# Patient Record
Sex: Female | Born: 1970 | Race: Black or African American | Hispanic: No | Marital: Married | Smoking: Never smoker
Health system: Southern US, Community
[De-identification: ages and names within clinical notes are randomized; demographics above are authoritative.]

## PROBLEM LIST (undated history)

## (undated) DIAGNOSIS — Z780 Asymptomatic menopausal state: Secondary | ICD-10-CM

## (undated) DIAGNOSIS — N6019 Diffuse cystic mastopathy of unspecified breast: Secondary | ICD-10-CM

## (undated) HISTORY — DX: Asymptomatic menopausal state: Z78.0

## (undated) HISTORY — PX: WISDOM TOOTH EXTRACTION: SHX21

## (undated) HISTORY — PX: BREAST EXCISIONAL BIOPSY: SUR124

## (undated) HISTORY — DX: Diffuse cystic mastopathy of unspecified breast: N60.19

---

## 2005-08-24 ENCOUNTER — Encounter: Admission: RE | Admit: 2005-08-24 | Discharge: 2005-08-24 | Payer: Self-pay | Admitting: Family Medicine

## 2008-04-03 HISTORY — PX: BREAST SURGERY: SHX581

## 2008-07-02 ENCOUNTER — Ambulatory Visit: Payer: Self-pay | Admitting: General Surgery

## 2008-07-09 ENCOUNTER — Ambulatory Visit: Payer: Self-pay | Admitting: General Surgery

## 2011-01-23 ENCOUNTER — Observation Stay: Payer: Self-pay

## 2011-01-27 ENCOUNTER — Inpatient Hospital Stay: Payer: Self-pay

## 2011-04-04 DIAGNOSIS — N6019 Diffuse cystic mastopathy of unspecified breast: Secondary | ICD-10-CM | POA: Insufficient documentation

## 2011-04-04 HISTORY — DX: Diffuse cystic mastopathy of unspecified breast: N60.19

## 2011-08-24 DIAGNOSIS — M654 Radial styloid tenosynovitis [de Quervain]: Secondary | ICD-10-CM | POA: Insufficient documentation

## 2012-08-13 ENCOUNTER — Encounter: Payer: Self-pay | Admitting: *Deleted

## 2012-08-20 ENCOUNTER — Ambulatory Visit: Payer: Self-pay | Admitting: General Surgery

## 2012-09-03 ENCOUNTER — Ambulatory Visit (INDEPENDENT_AMBULATORY_CARE_PROVIDER_SITE_OTHER): Payer: BC Managed Care – PPO | Admitting: General Surgery

## 2012-09-03 ENCOUNTER — Encounter: Payer: Self-pay | Admitting: General Surgery

## 2012-09-03 VITALS — BP 110/62 | HR 74 | Resp 12 | Ht 61.0 in | Wt 122.0 lb

## 2012-09-03 DIAGNOSIS — N6019 Diffuse cystic mastopathy of unspecified breast: Secondary | ICD-10-CM

## 2012-09-03 DIAGNOSIS — N644 Mastodynia: Secondary | ICD-10-CM

## 2012-09-03 NOTE — Progress Notes (Signed)
Patient ID: Deborah Armstrong, female   DOB: 27-Feb-1971, 42 y.o.   MRN: [id number]  Chief Complaint  Patient presents with  . Breast Problem    pain    HPI Deborah Armstrong is a 42 y.o. female.  Patient here today for breast pain evaluation for about 4-5 months.  States that it comes and goes and only last less than a minute upper outer quadrant area.  No breast trauma or  Injury.  States it is happening in both breast.  Was last seen here in July 2013, known history of fibrocystic breast disease and with left breast cyst. No family history of breast cancer.   HPI  Past Medical History  Diagnosis Date  . Diffuse cystic mastopathy 2013  . Breast screening, unspecified   . Breast complaint 2011    Past Surgical History  Procedure Laterality Date  . Cesarean section  2012  . Breast surgery Left 2010    FNS and left breast mass excision    History reviewed. No pertinent family history.  Social History History  Substance Use Topics  . Smoking status: Never Smoker   . Smokeless tobacco: Not on file  . Alcohol Use: Yes     Comment: 1-2 drinks    No Known Allergies  No current outpatient prescriptions on file.   No current facility-administered medications for this visit.    Review of Systems Review of Systems  Constitutional: Negative.   Respiratory: Negative.   Cardiovascular: Negative.     Blood pressure 110/62, pulse 74, resp. rate 12, height 5\' 1"  (1.549 m), weight 122 lb (55.339 kg), last menstrual period 08/20/2012.  Physical Exam Physical Exam  Constitutional: She is oriented to person, place, and time. She appears well-developed and well-nourished.  Eyes: Conjunctivae are normal.  Pulmonary/Chest: Right breast exhibits no inverted nipple, no mass, no nipple discharge, no skin change and no tenderness. Left breast exhibits no inverted nipple, no mass, no nipple discharge, no skin change and no tenderness.  Lymphadenopathy:    She has no cervical adenopathy.     She has no axillary adenopathy.  Neurological: She is alert and oriented to person, place, and time.  Skin: Skin is warm and dry.    Data Reviewed none  Assessment    Symmetrical bilateral breast pain outer aspect intermittent in nature.  No findings on exam.    Plan     Mammogram and office visit in July as scheduled. Ultrasound in office if indicated.       Keishawn Rajewski G 09/05/2012, 8:18 AM

## 2012-09-03 NOTE — Patient Instructions (Addendum)
Continue self breast exams. Call office for any new breast issues or concerns.  Mammogram and office visit to follow in July

## 2012-09-05 ENCOUNTER — Encounter: Payer: Self-pay | Admitting: General Surgery

## 2012-11-05 ENCOUNTER — Ambulatory Visit: Payer: BC Managed Care – PPO | Admitting: General Surgery

## 2012-11-06 ENCOUNTER — Encounter: Payer: Self-pay | Admitting: General Surgery

## 2012-12-16 ENCOUNTER — Encounter: Payer: Self-pay | Admitting: General Surgery

## 2012-12-16 ENCOUNTER — Ambulatory Visit (INDEPENDENT_AMBULATORY_CARE_PROVIDER_SITE_OTHER): Payer: BC Managed Care – PPO | Admitting: General Surgery

## 2012-12-16 VITALS — BP 106/60 | HR 77 | Resp 13 | Ht 64.0 in | Wt 123.0 lb

## 2012-12-16 DIAGNOSIS — N6019 Diffuse cystic mastopathy of unspecified breast: Secondary | ICD-10-CM

## 2012-12-16 NOTE — Patient Instructions (Addendum)

## 2012-12-16 NOTE — Progress Notes (Addendum)
Patient ID: Deborah Armstrong, female   DOB: [birth year], 42 y.o.   MRN: [id number]  Chief Complaint  Patient presents with  . Follow-up    mammogram    HPI Deborah Armstrong is a 42 y.o. female who presents for a breast evaluation. The most recent mammogram was done on 11/05/12 cat 1. Patient does perform regular self breast checks and gets regular mammograms done.    HPI  Past Medical History  Diagnosis Date  . Diffuse cystic mastopathy 2013  . Breast screening, unspecified   . Breast complaint 2011    Past Surgical History  Procedure Laterality Date  . Cesarean section  2012  . Breast surgery Left 2010    FNS and left breast mass excision    History reviewed. No pertinent family history.  Social History History  Substance Use Topics  . Smoking status: Never Smoker   . Smokeless tobacco: Never Used  . Alcohol Use: Yes     Comment: 1-2 drinks    No Known Allergies  No current outpatient prescriptions on file.   No current facility-administered medications for this visit.    Review of Systems Review of Systems  Constitutional: Negative.   Respiratory: Negative.   Cardiovascular: Negative.     Blood pressure 106/60, pulse 77, resp. rate 13, height 5\' 4"  (1.626 m), weight 123 lb (55.792 kg), last menstrual period 12/02/2012.  Physical Exam Physical Exam  Constitutional: She is oriented to person, place, and time. She appears well-developed and well-nourished.  Eyes: Conjunctivae are normal. No scleral icterus.  Neck: No mass and no thyromegaly present.  Cardiovascular: Normal rate, regular rhythm, normal heart sounds, intact distal pulses and normal pulses.   Pulmonary/Chest: Breath sounds normal. Right breast exhibits no inverted nipple, no mass, no nipple discharge, no skin change and no tenderness. Left breast exhibits no inverted nipple, no mass, no nipple discharge, no skin change and no tenderness.  Abdominal: Soft. Normal appearance and bowel sounds are  normal. There is no hepatosplenomegaly. There is no tenderness. No hernia.  Lymphadenopathy:    She has no cervical adenopathy.    She has no axillary adenopathy.  Neurological: She is alert and oriented to person, place, and time.  Skin: Skin is warm and dry.    Data Reviewed Mammogram reviewed  Assessment    Stable exam.     Plan    Patient to return in one year after mammogram.       SANKAR,SEEPLAPUTHUR G 12/16/2012, 3:13 PM

## 2013-12-11 ENCOUNTER — Encounter: Payer: Self-pay | Admitting: General Surgery

## 2013-12-17 ENCOUNTER — Ambulatory Visit: Payer: BC Managed Care – PPO | Admitting: General Surgery

## 2014-01-01 ENCOUNTER — Ambulatory Visit (INDEPENDENT_AMBULATORY_CARE_PROVIDER_SITE_OTHER): Payer: BC Managed Care – PPO | Admitting: General Surgery

## 2014-01-01 ENCOUNTER — Encounter: Payer: Self-pay | Admitting: General Surgery

## 2014-01-01 VITALS — BP 108/4 | HR 74 | Resp 12 | Ht 61.0 in | Wt 127.0 lb

## 2014-01-01 DIAGNOSIS — Z9889 Other specified postprocedural states: Secondary | ICD-10-CM

## 2014-01-01 NOTE — Patient Instructions (Signed)
Patient to return in 1 year for follow up.Continue self breast exams. Call office for any new breast issues or concerns.

## 2014-01-01 NOTE — Progress Notes (Signed)
Patient ID: Deborah Armstrong, female   DOB: [birth year], 43 y.o.   MRN: [id number]  Chief Complaint  Patient presents with  . Follow-up    1 year follow up mammogram     HPI Deborah Armstrong is a 43 y.o. female who presents for a breast evaluation. The most recent mammogram was done on 12/10/13. Patient does perform regular self breast checks and gets regular mammograms done. The patient denies any new problems at this time.    HPI  Past Medical History  Diagnosis Date  . Diffuse cystic mastopathy 2013  . Breast screening, unspecified   . Breast complaint 2011    Past Surgical History  Procedure Laterality Date  . Cesarean section  2012  . Breast surgery Left 2010    FNS and left breast mass excision    Family History  Problem Relation Age of Onset  . Cancer Neg Hx     Social History History  Substance Use Topics  . Smoking status: Never Smoker   . Smokeless tobacco: Never Used  . Alcohol Use: Yes     Comment: 1-2 drinks    No Known Allergies  No current outpatient prescriptions on file.   No current facility-administered medications for this visit.    Review of Systems Review of Systems  Constitutional: Negative.   Respiratory: Negative.   Cardiovascular: Negative.     Blood pressure 108/4, pulse 74, resp. rate 12, height 5' 1"  (1.549 m), weight 127 lb (57.607 kg), last menstrual period 12/25/2013.  Physical Exam Physical Exam  Constitutional: She is oriented to person, place, and time. She appears well-developed and well-nourished.  Eyes: Conjunctivae are normal. No scleral icterus.  Neck: Neck supple. No thyromegaly present.  Cardiovascular: Normal rate, regular rhythm and normal heart sounds.   No murmur heard. Pulmonary/Chest: Effort normal and breath sounds normal. Right breast exhibits no inverted nipple, no mass, no nipple discharge, no skin change and no tenderness. Left breast exhibits no inverted nipple, no mass, no nipple discharge, no skin  change and no tenderness.  Lymphadenopathy:    She has no cervical adenopathy.    She has no axillary adenopathy.  Neurological: She is alert and oriented to person, place, and time.  Skin: Skin is warm and dry.    Data Reviewed  None  Assessment    Stable exam.     Plan    Patient to return in 1 year bilateral screening mammogram.        Carmellia Kreisler G 01/02/2014, 10:40 AM

## 2014-01-02 ENCOUNTER — Encounter: Payer: Self-pay | Admitting: General Surgery

## 2014-02-02 ENCOUNTER — Encounter: Payer: Self-pay | Admitting: General Surgery

## 2014-10-22 ENCOUNTER — Other Ambulatory Visit: Payer: Self-pay

## 2014-10-22 DIAGNOSIS — Z1231 Encounter for screening mammogram for malignant neoplasm of breast: Secondary | ICD-10-CM

## 2014-12-23 ENCOUNTER — Ambulatory Visit: Payer: Self-pay | Admitting: General Surgery

## 2015-01-06 ENCOUNTER — Ambulatory Visit: Payer: Self-pay | Admitting: General Surgery

## 2015-01-14 ENCOUNTER — Ambulatory Visit (INDEPENDENT_AMBULATORY_CARE_PROVIDER_SITE_OTHER): Payer: 59 | Admitting: General Surgery

## 2015-01-14 ENCOUNTER — Encounter: Payer: Self-pay | Admitting: General Surgery

## 2015-01-14 VITALS — BP 98/60 | HR 82 | Resp 12 | Ht 61.0 in | Wt 120.0 lb

## 2015-01-14 DIAGNOSIS — N6019 Diffuse cystic mastopathy of unspecified breast: Secondary | ICD-10-CM

## 2015-01-14 NOTE — Patient Instructions (Signed)
Call if any concerns.

## 2015-01-14 NOTE — Progress Notes (Signed)
Patient ID: Deborah Armstrong, female   DOB: September 30, 1970, 44 y.o.   MRN: [id number]  Chief Complaint  Patient presents with  . Follow-up    mammogram     HPI Deborah Armstrong is a 44 y.o. female who presents for a breast evaluation. The most recent mammogram was done on 12/15/14.  Patient does perform regular self breast checks and gets regular mammograms done.  No new breast issues. She is post-menopausal and has vasomotor symptoms.  HPI  Past Medical History  Diagnosis Date  . Diffuse cystic mastopathy 2013  . Breast screening, unspecified   . Breast complaint 2011    Past Surgical History  Procedure Laterality Date  . Cesarean section  2012  . Breast surgery Left 2010    FNA and left breast mass excision    Family History  Problem Relation Age of Onset  . Cancer Neg Hx     Social History Social History  Substance Use Topics  . Smoking status: Never Smoker   . Smokeless tobacco: Never Used  . Alcohol Use: Yes     Comment: 1-2 drinks    No Known Allergies  No current outpatient prescriptions on file.   No current facility-administered medications for this visit.    Review of Systems Review of Systems  Constitutional: Negative.   Respiratory: Negative.   Cardiovascular: Negative.     Blood pressure 98/60, pulse 82, resp. rate 12, height 5' 1"  (1.549 m), weight 120 lb (54.432 kg), last menstrual period 09/03/2014.  Physical Exam Physical Exam  Constitutional: She is oriented to person, place, and time. She appears well-developed and well-nourished.  HENT:  Mouth/Throat: Oropharynx is clear and moist.  Eyes: Conjunctivae are normal. No scleral icterus.  Neck: Neck supple.  Cardiovascular: Normal rate, regular rhythm and normal heart sounds.   Pulmonary/Chest: Effort normal and breath sounds normal. Right breast exhibits no inverted nipple, no mass, no nipple discharge, no skin change and no tenderness. Left breast exhibits no inverted nipple, no mass, no  nipple discharge, no skin change and no tenderness.  Left breast: Minimal scarring palpable underneath previous biopsy site upper outer quadrant  Lymphadenopathy:    She has no cervical adenopathy.    She has no axillary adenopathy.  Neurological: She is alert and oriented to person, place, and time.  Skin: Skin is warm and dry.  Psychiatric: Her behavior is normal.    Data Reviewed Mammogram report.  Assessment    Stable breast exam.    Plan    Return in 1 year with bilateral screening mammogram.     PCP: Oren Binet 01/14/2015, 6:52 PM

## 2015-11-30 ENCOUNTER — Encounter: Payer: Self-pay | Admitting: *Deleted

## 2016-01-10 ENCOUNTER — Ambulatory Visit: Payer: 59 | Admitting: General Surgery

## 2016-01-20 ENCOUNTER — Encounter: Payer: Self-pay | Admitting: *Deleted

## 2016-01-21 ENCOUNTER — Encounter: Payer: Self-pay | Admitting: General Surgery

## 2016-01-26 ENCOUNTER — Ambulatory Visit (INDEPENDENT_AMBULATORY_CARE_PROVIDER_SITE_OTHER): Payer: BC Managed Care – PPO | Admitting: General Surgery

## 2016-01-26 ENCOUNTER — Encounter: Payer: Self-pay | Admitting: General Surgery

## 2016-01-26 VITALS — BP 98/62 | HR 76 | Resp 12 | Ht 61.0 in | Wt 122.0 lb

## 2016-01-26 DIAGNOSIS — N6019 Diffuse cystic mastopathy of unspecified breast: Secondary | ICD-10-CM | POA: Diagnosis not present

## 2016-01-26 NOTE — Progress Notes (Signed)
Patient ID: Deborah Armstrong, female   DOB: [birth year], 45 y.o.   MRN: [id number]  Chief Complaint  Patient presents with  . Follow-up    mammogram    HPI ETHERINE Armstrong is a 45 y.o. female who presents for a breast evaluation. The most recent mammogram was done on 1016/2017.  Patient does perform regular self breast checks and gets regular mammograms done.   No new issues.  Reports LMP about 1.5 years ago.  No associated symptoms.   I have reviewed the history of present illness with the patient.  HPI  Past Medical History:  Diagnosis Date  . Breast complaint 2011  . Breast screening, unspecified   . Diffuse cystic mastopathy 2013    Past Surgical History:  Procedure Laterality Date  . BREAST SURGERY Left 2010   FNA and left breast mass excision  . CESAREAN SECTION  2012    Family History  Problem Relation Age of Onset  . Cancer Neg Hx     Social History Social History  Substance Use Topics  . Smoking status: Never Smoker  . Smokeless tobacco: Never Used  . Alcohol use Yes     Comment: 1-2 drinks    No Known Allergies  No current outpatient prescriptions on file.   No current facility-administered medications for this visit.     Review of Systems Review of Systems  Constitutional: Negative.   Respiratory: Negative.   Cardiovascular: Negative.     Blood pressure 98/62, pulse 76, resp. rate 12, height 5' 1"  (1.549 m), weight 122 lb (55.3 kg), last menstrual period 08/26/2014.  Physical Exam Physical Exam  Constitutional: She is oriented to person, place, and time. She appears well-developed and well-nourished.  Eyes: Conjunctivae are normal. No scleral icterus.  Neck: Neck supple.  Cardiovascular: Normal rate, regular rhythm and normal heart sounds.   Pulmonary/Chest: Effort normal and breath sounds normal. Right breast exhibits no inverted nipple, no mass, no nipple discharge, no skin change and no tenderness. Left breast exhibits no inverted nipple,  no mass, no nipple discharge, no skin change and no tenderness.  Abdominal: Soft. There is no tenderness.  Lymphadenopathy:    She has no cervical adenopathy.    She has no axillary adenopathy.  Neurological: She is alert and oriented to person, place, and time.  Skin: Skin is warm and dry.    Data Reviewed Mammogram reviewed   Assessment    Stable breast exam. Hx of diffuse cystic mastopathy, FCBD.     Plan   The patient has been asked to return to the office in one year with a bilateral screening mammogram.  This information has been scribed by Gaspar Cola CMA.      Jaquanda Wickersham G 01/26/2016, 3:46 PM

## 2016-01-26 NOTE — Patient Instructions (Signed)
The patient has been asked to return to the office in one year with a bilateral screening mammogram.

## 2017-01-22 ENCOUNTER — Encounter: Payer: Self-pay | Admitting: General Surgery

## 2017-01-24 ENCOUNTER — Encounter: Payer: Self-pay | Admitting: General Surgery

## 2017-01-24 ENCOUNTER — Ambulatory Visit (INDEPENDENT_AMBULATORY_CARE_PROVIDER_SITE_OTHER): Payer: BC Managed Care – PPO | Admitting: General Surgery

## 2017-01-24 VITALS — BP 112/72 | HR 68 | Ht 61.0 in | Wt 103.0 lb

## 2017-01-24 DIAGNOSIS — N6019 Diffuse cystic mastopathy of unspecified breast: Secondary | ICD-10-CM

## 2017-01-24 NOTE — Progress Notes (Signed)
Patient ID: Deborah Armstrong, female   DOB: Jun 26, 1970, 46 y.o.   MRN: [id number]  Chief Complaint  Patient presents with  . Follow-up    HPI Deborah Armstrong is a 46 y.o. female who presents for a breast evaluation. The most recent mammogram was done on 01/19/2017.Patient does perform regular self breast checks and gets regular mammograms done.    HPI  Past Medical History:  Diagnosis Date  . Breast complaint 2011  . Breast screening, unspecified   . Diffuse cystic mastopathy 2013    Past Surgical History:  Procedure Laterality Date  . BREAST SURGERY Left 2010   FNA and left breast mass excision  . CESAREAN SECTION  2012    Family History  Problem Relation Age of Onset  . Cancer Neg Hx     Social History Social History  Substance Use Topics  . Smoking status: Never Smoker  . Smokeless tobacco: Never Used  . Alcohol use Yes     Comment: 1-2 drinks    No Known Allergies  No current outpatient prescriptions on file.   No current facility-administered medications for this visit.     Review of Systems Review of Systems  Constitutional: Negative.   Respiratory: Negative.     Blood pressure 112/72, pulse 68, height 5' 1"  (1.549 m), weight 103 lb (46.7 kg).  Physical Exam Physical Exam  Constitutional: She is oriented to person, place, and time. She appears well-developed and well-nourished.  Eyes: Conjunctivae are normal. No scleral icterus.  Neck: Neck supple.  Cardiovascular: Normal rate, regular rhythm and normal heart sounds.   Pulmonary/Chest: Effort normal and breath sounds normal. Right breast exhibits no inverted nipple, no mass, no nipple discharge, no skin change and no tenderness. Left breast exhibits no inverted nipple, no mass, no nipple discharge, no skin change and no tenderness.  Lymphadenopathy:    She has no cervical adenopathy.    She has no axillary adenopathy.  Neurological: She is alert and oriented to person, place, and time.  Skin:  Skin is warm and dry.       Data Reviewed Mammogram 01/19/17- stable. Revealed very dense breast tissue which lowers the accuracy of testing. No acute findings or signs concerning for malignancy at this time.   Assessment    Mammogram stable with dense breast tissue. No findings concerning for malignancy at this time.   Pt also had an approx 5cm right forearm laceration repaired with silk sutures about 10 days ago. She went to urgent care to have the stitches removed at day seven but, after removing one they stated the area had not healed well enough to remove all of them. The area was cleaned and sutures removed with sterile instruments. Steri strips were used to re-enforce the laceration site and were covered with Telfa and tegaderm.   Plan  Patient to return to her OBGYN for mammogram and breast checks. The patient is aware to call back for any questions or concerns.   Instructed pt to return in 7-10 days for nurse appointment for re-check of the area to ensure adequate wound healing.      HPI, Physical Exam, Assessment and Plan have been scribed under the direction and in the presence of Mckinley Jewel, MD  Gaspar Cola, CMA   I have completed the exam and reviewed the above documentation for accuracy and completeness.  I agree with the above.  Haematologist has been used and any errors in dictation or transcription are unintentional.  Kaetlyn Noa G. Jamal Collin, M.D., F.A.C.S.  Junie Panning G 01/24/2017, 5:50 PM

## 2017-01-24 NOTE — Patient Instructions (Signed)
Patient to return to her OBGYN for mammogram and breast checks. The patient is aware to call back for any questions or concerns.

## 2017-02-02 ENCOUNTER — Ambulatory Visit (INDEPENDENT_AMBULATORY_CARE_PROVIDER_SITE_OTHER): Payer: BC Managed Care – PPO | Admitting: Certified Nurse Midwife

## 2017-02-02 ENCOUNTER — Ambulatory Visit (INDEPENDENT_AMBULATORY_CARE_PROVIDER_SITE_OTHER): Payer: BC Managed Care – PPO | Admitting: *Deleted

## 2017-02-02 ENCOUNTER — Encounter: Payer: Self-pay | Admitting: Certified Nurse Midwife

## 2017-02-02 VITALS — BP 102/70 | HR 71 | Ht 61.0 in | Wt 103.0 lb

## 2017-02-02 DIAGNOSIS — Z113 Encounter for screening for infections with a predominantly sexual mode of transmission: Secondary | ICD-10-CM

## 2017-02-02 DIAGNOSIS — Z01419 Encounter for gynecological examination (general) (routine) without abnormal findings: Secondary | ICD-10-CM | POA: Diagnosis not present

## 2017-02-02 DIAGNOSIS — N6019 Diffuse cystic mastopathy of unspecified breast: Secondary | ICD-10-CM

## 2017-02-02 DIAGNOSIS — Z124 Encounter for screening for malignant neoplasm of cervix: Secondary | ICD-10-CM

## 2017-02-02 NOTE — Progress Notes (Signed)
Patient came in today for a wound check.  The wound is clean, with no signs of infection noted. .Follow up as scheduled.

## 2017-02-02 NOTE — Patient Instructions (Signed)
Preventing Osteoporosis, Adult Osteoporosis is a condition that causes the bones to get weaker. With osteoporosis, the bones become thinner, and the normal spaces in bone tissue become larger. This can make the bones weak and cause them to break more easily. People who have osteoporosis are more likely to break their wrist, spine, or hip. Even a minor accident or injury can be enough to break weak bones. Osteoporosis can occur with aging. Your body constantly replaces old bone tissue with new tissue. As you get older, you may lose bone tissue more quickly, or it may be replaced more slowly. Osteoporosis is more likely to develop if you have poor nutrition or do not get enough calcium or vitamin D. Other lifestyle factors can also play a role. By making some diet and lifestyle changes, you can help to keep your bones healthy and help to prevent osteoporosis. What nutrition changes can be made? Nutrition plays an important role in maintaining healthy, strong bones.  Make sure you get enough calcium every day from food or from calcium supplements. ? If you are age 49 or younger, aim to get 1,000 mg of calcium every day. ? If you are older than age 57, aim to get 1,200 mg of calcium every day.  Try to get enough vitamin D every day. ? If you are age 82 or younger, aim to get 600 international units (IU) every day. ? If you are older than age 15, aim to get 800 international units every day.  Follow a healthy diet. Eat plenty of foods that contain calcium and vitamin D. ? Calcium is in milk, cheese, yogurt, and other dairy products. Some fish and vegetables are also good sources of calcium. Many foods such as cereals and breads have had calcium added to them (are fortified). Check nutrition labels to see how much calcium is in a food or drink. ? Foods that contain vitamin D include milk, cereals, salmon, and tuna. Your body also makes vitamin D when you are out in the sun. Bare skin exposure to the sun on  your face, arms, legs, or back for no more than 30 minutes a day, 2 times per week is more than enough. Beyond that, it is important to use sunblock to protect your skin from sunburn, which increases your risk for skin cancer.  What lifestyle changes can be made? Making changes in your everyday life can also play an important role in preventing osteoporosis.  Stay active and get exercise every day. Ask your health care provider what types of exercise are best for you.  Do not use any products that contain nicotine or tobacco, such as cigarettes and e-cigarettes. If you need help quitting, ask your health care provider.  Limit alcohol intake to no more than 1 drink a day for nonpregnant women and 2 drinks a day for men. One drink equals 12 oz of beer, 5 oz of wine, or 1 oz of hard liquor.  Why are these changes important? Making these nutrition and lifestyle changes can:  Help you develop and maintain healthy, strong bones.  Prevent loss of bone mass and the problems that are caused by that loss, such as broken bones and delayed healing.  Make you feel better mentally and physically.  What can happen if changes are not made? Problems that can result from osteoporosis can be very serious. These may include:  A higher risk of broken bones that are painful and do not heal well.  Physical malformations, such as  a collapsed spine or a hunched back.  Problems with movement.  Where to find support: If you need help making changes to prevent osteoporosis, talk with your health care provider. You can ask for a referral to a diet and nutrition specialist (dietitian) and a physical therapist. Where to find more information: Learn more about osteoporosis from:  NIH Osteoporosis and Related [place]: www.niams.GolfingGoddess.com.br  [place] Office on Women's Health:  SouvenirBaseball.es.html  National Osteoporosis Foundation: ProfilePeek.ch  Summary  Osteoporosis is a condition that causes weak bones that are more likely to break.  Eating a healthy diet and making sure you get enough calcium and vitamin D can help prevent osteoporosis.  Other ways to reduce your risk of osteoporosis include getting regular exercise and avoiding alcohol and products that contain nicotine or tobacco. This information is not intended to replace advice given to you by your health care provider. Make sure you discuss any questions you have with your health care provider. Document Released: 04/04/2015 Document Revised: 11/29/2015 Document Reviewed: 11/29/2015 Elsevier Interactive Patient Education  [employer].

## 2017-02-02 NOTE — Progress Notes (Addendum)
Gynecology Annual Exam  PCP: Lennie Odor, PA-C  Chief Complaint:  Chief Complaint  Patient presents with  . Gynecologic Exam    History of Present Illness:Deborah Armstrong is a 46 year old African American/Black female, G1 P1001, who presents for her annual exam. She is having no significant GYN problems.   She is menopausal and had her last menses in the summer of 2016. Not having a lot of hot flashes at this time.  She has had no spotting.   The patient's past medical history is detailed in the past medical history section.  Since her last annual GYN exam dated 01/28/2016, she has lost a significant amount of weight (19#) due to stress from marital discord. She is back to eating again. Is not taking vitamins  Her most recent pap smear was obtained 01/26/2016 and was with negative cells and negative HPV DNA. She desires STD testing with her Pap smear today  Her most recent mammogram 01/19/2017 and was negative.  She was seen by Dr Jamal Collin 10/24 for an annual breast exam which was normal. Dr Jamal Collin released her to her PCP or GYN provider for future breast exams. Hx of excision of left breast fibroadenoma. There is no family history of breast cancer.  There is no family history of ovarian cancer.  The patient does do monthly self breast exams.  The patient does not smoke.  The patient does not drink alcohol.  The patient does not use illegal drugs.  The patient has not been exercising  She had a recent cholesterol screen in 2018 by [facility] that was normal.    The patient denies current symptoms of depression.    Review of Systems: Review of Systems  Constitutional: Positive for weight loss. Negative for chills and fever.  HENT: Negative for congestion, sinus pain and sore throat.   Eyes: Negative for blurred vision and pain.  Respiratory: Negative for hemoptysis, shortness of breath and wheezing.   Cardiovascular: Negative for chest pain, palpitations and leg  swelling.  Gastrointestinal: Negative for abdominal pain, blood in stool, diarrhea, heartburn, nausea and vomiting.  Genitourinary: Negative for dysuria, frequency, hematuria and urgency.  Musculoskeletal: Negative for back pain, joint pain and myalgias.  Skin: Negative for itching and rash.  Neurological: Negative for dizziness, tingling and headaches.  Endo/Heme/Allergies: Negative for environmental allergies and polydipsia. Does not bruise/bleed easily.       Negative for hirsutism   Psychiatric/Behavioral: Negative for depression. The patient is not nervous/anxious and does not have insomnia.     Past Medical History:  Past Medical History:  Diagnosis Date  . Breast complaint 2011  . Breast screening, unspecified   . Diffuse cystic mastopathy 2013  . Menopause    age 11    Past Surgical History:  Past Surgical History:  Procedure Laterality Date  . BREAST SURGERY Left 2010   FNA and left breast mass excision  . CESAREAN SECTION  2012    Family History:  Family History  Problem Relation Age of Onset  . Congestive Heart Failure Mother   . Hypertension Mother   . Hypertension Sister   . Sickle cell anemia Sister   . Hypertension Brother   . Cancer Neg Hx     Social History:  Social History   Socioeconomic History  . Marital status: Married    Spouse name: Not on file  . Number of children: 1  . Years of education: Not on file  . Highest education  level: Master's degree (e.g., MA, MS, MEng, MEd, MSW, MBA)  Social Needs  . Financial resource strain: Not on file  . Food insecurity - worry: Not on file  . Food insecurity - inability: Not on file  . Transportation needs - medical: Not on file  . Transportation needs - non-medical: Not on file  Occupational History  . Occupation: Clinical research associate  Tobacco Use  . Smoking status: Never Smoker  . Smokeless tobacco: Never Used  Substance and Sexual Activity  . Alcohol use: No    Frequency: Never  . Drug  use: No  . Sexual activity: Not Currently    Birth control/protection: None, Post-menopausal  Other Topics Concern  . Not on file  Social History Narrative  . Not on file    Allergies:  No Known Allergies  Medications: No current outpatient medications on file prior to visit.   No current facility-administered medications on file prior to visit.   Physical Exam Vitals: BP 102/70   Pulse 71   Ht 5' 1"  (1.549 m)   Wt 103 lb (46.7 kg)   LMP  (Exact Date)   BMI 19.46 kg/m   General: BF in NAD HEENT: normocephalic, anicteric Neck: no thyroid enlargement, no palpable nodules, no cervical lymphadenopathy  Pulmonary: No increased work of breathing, CTAB Cardiovascular: RRR, without murmur  Breast: Breast symmetrical, no tenderness, no palpable nodules or masses, no skin or nipple retraction present, no nipple discharge.  No axillary, infraclavicular or supraclavicular lymphadenopathy. Abdomen: Soft, non-tender, non-distended.  Umbilicus without lesions.  No hepatomegaly or masses palpable. No evidence of hernia. Genitourinary:  External: Normal external female genitalia.  Normal urethral meatus, normal Bartholin's and Skene's glands.    Vagina: Normal vaginal mucosa, no evidence of prolapse.    Cervix: Grossly normal in appearance, posterior, non-tender  Uterus: Anteverted, normal size, shape, and consistency, mobile, and non-tender  Adnexa: No adnexal masses, non-tender  Rectal: deferred  Lymphatic: no evidence of inguinal lymphadenopathy Extremities: no edema, erythema, or tenderness Neurologic: Grossly intact Psychiatric: mood appropriate, affect full     Assessment: 46 y.o. G1P1 normal postmenopausal gyn exam  Plan:   1) Breast cancer screening - recommend monthly self breast exam monthly and continued annual mammograms. Mammogram is up to date.  2) STI screening was offered and accepted.  3) Cervical cancer screening - Pap was done.  4) Contraception -not  needed  5) Routine healthcare maintenance including cholesterol and diabetes screening managed by PCP . DIscussed calcium and vitamin D3 requirements and encouraged exercises to help prevent osteoporosis  6) RTO 1 year and prn  Dalia Heading, CNM

## 2017-02-02 NOTE — Patient Instructions (Signed)
Return as scheduled 

## 2017-02-06 LAB — PAP IG, CT-NG, RFX HPV ALL
Chlamydia, Nuc. Acid Amp: NEGATIVE
Gonococcus by Nucleic Acid Amp: NEGATIVE
PAP Smear Comment: 0

## 2017-02-25 ENCOUNTER — Encounter: Payer: Self-pay | Admitting: Certified Nurse Midwife

## 2018-03-18 ENCOUNTER — Ambulatory Visit (INDEPENDENT_AMBULATORY_CARE_PROVIDER_SITE_OTHER): Payer: BC Managed Care – PPO | Admitting: Certified Nurse Midwife

## 2018-03-18 ENCOUNTER — Encounter: Payer: Self-pay | Admitting: Certified Nurse Midwife

## 2018-03-18 ENCOUNTER — Other Ambulatory Visit (HOSPITAL_COMMUNITY)
Admission: RE | Admit: 2018-03-18 | Discharge: 2018-03-18 | Disposition: A | Discharge status: Home / Self Care | Payer: BC Managed Care – PPO | Source: Ambulatory Visit | Attending: Certified Nurse Midwife | Admitting: Certified Nurse Midwife

## 2018-03-18 VITALS — BP 100/52 | Ht 61.0 in | Wt 108.0 lb

## 2018-03-18 DIAGNOSIS — Z124 Encounter for screening for malignant neoplasm of cervix: Secondary | ICD-10-CM

## 2018-03-18 DIAGNOSIS — Z01419 Encounter for gynecological examination (general) (routine) without abnormal findings: Secondary | ICD-10-CM | POA: Insufficient documentation

## 2018-03-18 DIAGNOSIS — Z1239 Encounter for other screening for malignant neoplasm of breast: Secondary | ICD-10-CM

## 2018-03-18 DIAGNOSIS — Z1211 Encounter for screening for malignant neoplasm of colon: Secondary | ICD-10-CM

## 2018-03-18 NOTE — Progress Notes (Signed)
Gynecology Annual Exam  PCP: Lennie Odor, PA-C  Chief Complaint:  Chief Complaint  Patient presents with  . Gynecologic Exam    History of Present Illness:Deborah Armstrong is a 47 year old African American/Black female, G1 P1001, who presents for her annual exam. She is having no significant GYN problems.   She is menopausal and had her last menses in the summer of 2016. Not having a lot of hot flashes at this time.  She has had no spotting. Denies vaginal dryness.  The patient's past medical history is detailed in the past medical history section.  Since her last annual GYN exam dated 02/02/2017, she has not had any significant changes to her health. Is eating better and has gained back some of the weight she lost after a stress period of marital discord. 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  Her most recent pap smear was obtained 02/02/2017 and was NIL.  Her most recent mammogram 01/19/2017 and was negative.  She was seen by Dr Jamal Collin 01/24/17 for an annual breast exam which was normal. Dr Jamal Collin released her to her PCP or GYN provider for future breast exams. Hx of excision of left breast fibroadenoma. There is no family history of breast cancer.  There is no family history of ovarian cancer.  The patient does do monthly self breast exams.  The patient does not smoke.  The patient does drink alcohol, maybe once a month. The patient does not use illegal drugs.  The patient has not been exercising  She had a recent cholesterol screen in 2018 by [facility] that was normal. She is having her annual exam with her PCP today.  The patient denies current symptoms of depression or anxiety   Review of Systems: Review of Systems  Constitutional: Negative for chills, fever and weight loss.  HENT: Negative for congestion, sinus pain and sore throat.   Eyes: Negative for blurred vision and pain.  Respiratory: Negative for hemoptysis, shortness of  breath and wheezing.   Cardiovascular: Negative for chest pain, palpitations and leg swelling.  Gastrointestinal: Negative for abdominal pain, blood in stool, diarrhea, heartburn, nausea and vomiting.  Genitourinary: Negative for dysuria, frequency, hematuria and urgency.  Musculoskeletal: Negative for back pain, joint pain and myalgias.  Skin: Negative for itching and rash.  Neurological: Negative for dizziness, tingling and headaches.  Endo/Heme/Allergies: Negative for environmental allergies and polydipsia. Does not bruise/bleed easily.       Negative for hirsutism   Psychiatric/Behavioral: Negative for depression. The patient is not nervous/anxious and does not have insomnia.   Positive for cyclical breast tenderness  Past Medical History:  Past Medical History:  Diagnosis Date  . Diffuse cystic mastopathy 2013  . Menopause    age 14    Past Surgical History:  Past Surgical History:  Procedure Laterality Date  . BREAST SURGERY Left 2010   FNA and left breast mass excision  . CESAREAN SECTION  2012    Family History:  Family History  Problem Relation Age of Onset  . Congestive Heart Failure Mother   . Hypertension Mother   . Hypertension Sister   . Sickle cell anemia Sister   . Hypertension Brother   . Cancer Neg Hx     Social History:  Social History   Socioeconomic History  . Marital status: Married    Spouse name: Not on file  . Number of children: 1  . Years of education:  Not on file  . Highest education level: Master's degree (e.g., MA, MS, MEng, MEd, MSW, MBA)  Occupational History  . Occupation: Clinical research associate  Social Needs  . Financial resource strain: Not on file  . Food insecurity:    Worry: Not on file    Inability: Not on file  . Transportation needs:    Medical: Not on file    Non-medical: Not on file  Tobacco Use  . Smoking status: Never Smoker  . Smokeless tobacco: Never Used  Substance and Sexual Activity  . Alcohol use: No      Frequency: Never  . Drug use: No  . Sexual activity: Not Currently    Birth control/protection: None, Post-menopausal  Lifestyle  . Physical activity:    Days per week: Not on file    Minutes per session: Not on file  . Stress: Not on file  Relationships  . Social connections:    Talks on phone: Not on file    Gets together: Not on file    Attends religious service: Not on file    Active member of club or organization: Not on file    Attends meetings of clubs or organizations: Not on file    Relationship status: Not on file  . Intimate partner violence:    Fear of current or ex partner: Not on file    Emotionally abused: Not on file    Physically abused: Not on file    Forced sexual activity: Not on file  Other Topics Concern  . Not on file  Social History Narrative  . Not on file    Allergies:  No Known Allergies  Medications: No current outpatient medications on file prior to visit.   No current facility-administered medications on file prior to visit.   Physical Exam Vitals: BP (!) 100/52 (BP Location: Left Arm, Patient Position: Sitting, Cuff Size: Normal)   Ht 5' 1"  (1.549 m)   Wt 108 lb (49 kg)   LMP 09/03/2014   BMI 20.41 kg/m   General: BF in NAD HEENT: normocephalic, anicteric Neck: no thyroid enlargement, no palpable nodules, no cervical lymphadenopathy  Pulmonary: No increased work of breathing, CTAB Cardiovascular: RRR, without murmur  Breast: Breast symmetrical, no tenderness, no palpable nodules or masses, no skin or nipple retraction present, no nipple discharge.  No axillary, infraclavicular or supraclavicular lymphadenopathy. Abdomen: Soft, non-tender, non-distended.  Umbilicus without lesions.  No hepatomegaly or masses palpable. No evidence of hernia. Genitourinary:  External: Normal external female genitalia.  Normal urethral meatus, normal Bartholin's and Skene's glands.    Vagina: Normal vaginal mucosa, no evidence of prolapse.    Cervix:  Grossly normal in appearance, posterior, non-tender  Uterus: Anteverted, normal size, shape, and consistency, mobile, and non-tender  Adnexa: No adnexal masses, non-tender  Rectal: deferred  Lymphatic: no evidence of inguinal lymphadenopathy Extremities: no edema, erythema, or tenderness Neurologic: Grossly intact Psychiatric: mood appropriate, affect full     Assessment: 47 y.o. G1P1 normal postmenopausal gyn exam  Plan:   1) Breast cancer screening - recommend monthly self breast exam monthly and continued annual mammograms. Mammogram ordered today. Patient to schedule at [facility].  2) STI screening was offered and declined.  3) Cervical cancer screening - Pap was done.  4) Contraception -not needed  5) Routine healthcare maintenance including cholesterol and diabetes screening managed by PCP . DIscussed calcium and vitamin D3 requirements and encouraged exercises to help prevent osteoporosis  6) Colon cancer screening: discussed colon cancer  screening options (colonoscopy, Cologuard, FIT test) and patient desires Cologuard, which was ordered.  6) RTO 1 year and prn  Dalia Heading, CNM

## 2018-03-19 LAB — CYTOLOGY - PAP
Diagnosis: NEGATIVE
HPV: NOT DETECTED

## 2018-04-09 LAB — COLOGUARD

## 2018-04-12 ENCOUNTER — Other Ambulatory Visit: Payer: Self-pay | Admitting: Certified Nurse Midwife

## 2018-04-12 DIAGNOSIS — Z1231 Encounter for screening mammogram for malignant neoplasm of breast: Secondary | ICD-10-CM

## 2018-05-22 ENCOUNTER — Ambulatory Visit
Admission: RE | Admit: 2018-05-22 | Discharge: 2018-05-22 | Disposition: A | Discharge status: Home / Self Care | Payer: BC Managed Care – PPO | Source: Ambulatory Visit | Attending: Certified Nurse Midwife | Admitting: Certified Nurse Midwife

## 2018-05-22 DIAGNOSIS — Z1231 Encounter for screening mammogram for malignant neoplasm of breast: Secondary | ICD-10-CM

## 2018-06-10 ENCOUNTER — Telehealth: Payer: Self-pay

## 2018-06-10 NOTE — Telephone Encounter (Signed)
Pt was seen 03/18/18 by CLG.  She is receiveing a bill from  [facility] for over $300 for a pap smear.  Is there a reason why she is getting a pap annually when [employer] only covers pap q52yr?  She does get mammo qyr d/t issues c cysts.  Is there any coorelation?  [phone](okay to leave detailed msg)

## 2018-06-11 NOTE — Telephone Encounter (Signed)
Deborah Armstrong, I messaged her on MyChart and recommended she call her insurance or call the number on the bill to discuss this . Have never had a Private insurer refuse to pay for an annual Pap. [employer]

## 2019-03-24 ENCOUNTER — Ambulatory Visit: Payer: BC Managed Care – PPO | Admitting: Certified Nurse Midwife

## 2019-04-22 ENCOUNTER — Ambulatory Visit: Payer: BC Managed Care – PPO | Admitting: Certified Nurse Midwife

## 2019-05-02 ENCOUNTER — Other Ambulatory Visit: Payer: Self-pay | Admitting: Certified Nurse Midwife

## 2019-05-02 DIAGNOSIS — Z1231 Encounter for screening mammogram for malignant neoplasm of breast: Secondary | ICD-10-CM

## 2019-06-06 ENCOUNTER — Ambulatory Visit: Payer: BC Managed Care – PPO | Admitting: Certified Nurse Midwife

## 2019-06-12 ENCOUNTER — Other Ambulatory Visit: Payer: Self-pay

## 2019-06-12 ENCOUNTER — Ambulatory Visit
Admission: RE | Admit: 2019-06-12 | Discharge: 2019-06-12 | Disposition: A | Discharge status: Home / Self Care | Payer: BC Managed Care – PPO | Source: Ambulatory Visit | Attending: Certified Nurse Midwife | Admitting: Certified Nurse Midwife

## 2019-06-12 DIAGNOSIS — Z1231 Encounter for screening mammogram for malignant neoplasm of breast: Secondary | ICD-10-CM

## 2019-07-22 ENCOUNTER — Ambulatory Visit (INDEPENDENT_AMBULATORY_CARE_PROVIDER_SITE_OTHER): Payer: BC Managed Care – PPO | Admitting: Certified Nurse Midwife

## 2019-07-22 ENCOUNTER — Other Ambulatory Visit: Payer: Self-pay

## 2019-07-22 ENCOUNTER — Encounter: Payer: Self-pay | Admitting: Certified Nurse Midwife

## 2019-07-22 VITALS — BP 112/76 | Ht 61.0 in | Wt 107.0 lb

## 2019-07-22 DIAGNOSIS — Z01419 Encounter for gynecological examination (general) (routine) without abnormal findings: Secondary | ICD-10-CM | POA: Diagnosis not present

## 2019-07-22 NOTE — Progress Notes (Signed)
Gynecology Annual Exam  PCP: Lennie Odor, [place]  Chief Complaint:  Chief Complaint  Patient presents with  . Gynecologic Exam    History of Present Illness:Deborah Armstrong is a 49 year old African American/Black female, G1 P1001, who presents for her annual exam. She is having no significant GYN problems.   She is menopausal and had her last menses in the summer of 2016. She has had no spotting.  The patient's past medical history is detailed in the past medical history section.  Since her last annual GYN exam dated 03/18/2018, she has not had any significant changes to her health. DId have a mild case of Covid in early March 2021. Had her first Covid vaccine this past weekend. She is also continuing to work toward her PHD  in Leadership thru [employer].  Her most recent pap smear was obtained 03/18/2018 and was NIL/neg HRHPV.  Her most recent mammogram 06/12/2019 and was negative.  Hx of excision of left breast fibroadenoma. There is no family history of breast cancer.  There is no family history of ovarian cancer.  The patient does do monthly self breast exams.  The patient does not smoke.  The patient does drink alcohol, maybe once a month. The patient does not use illegal drugs.  The patient has been starting to exercise recently She may not get adequate calcium in her diet. She had a recent cholesterol screen in 2018 or 2019 by [facility] that was normal. She is having her annual exam with her PCP today.  The patient denies current symptoms of depression or anxiety    Review of Systems: Review of Systems  Constitutional: Negative for chills, fever and weight loss.  HENT: Positive for congestion. Negative for sinus pain and sore throat.   Eyes: Negative for blurred vision and pain.  Respiratory: Negative for hemoptysis, shortness of breath and wheezing.   Cardiovascular: Negative for chest pain, palpitations and leg swelling.  Gastrointestinal: Negative for  abdominal pain, blood in stool, diarrhea, heartburn, nausea and vomiting.  Genitourinary: Negative for dysuria, frequency, hematuria and urgency.  Musculoskeletal: Negative for back pain, joint pain and myalgias.  Skin: Negative for itching and rash.  Neurological: Negative for dizziness, tingling and headaches.  Endo/Heme/Allergies: Positive for environmental allergies (with sneezing). Negative for polydipsia. Does not bruise/bleed easily.       Negative for hirsutism   Psychiatric/Behavioral: Negative for depression. The patient is not nervous/anxious and does not have insomnia.   Positive for intermittent  breast tenderness  Past Medical History:  Past Medical History:  Diagnosis Date  . Diffuse cystic mastopathy 2013  . Menopause    age 44    Past Surgical History:  Past Surgical History:  Procedure Laterality Date  . BREAST EXCISIONAL BIOPSY Left   . BREAST SURGERY Left 2010   FNA and left breast mass excision  . CESAREAN SECTION  2012  . WISDOM TOOTH EXTRACTION      Family History:  Family History  Problem Relation Age of Onset  . Congestive Heart Failure Mother   . Hypertension Mother   . Hypertension Sister   . Sickle cell anemia Sister   . Hypertension Brother   . Cancer Neg Hx     Social History:  Social History   Socioeconomic History  . Marital status: Married    Spouse name: Not on file  . Number of children: 1  . Years of education: Not on file  . Highest education  level: Master's degree (e.g., MA, MS, MEng, MEd, MSW, MBA)  Occupational History  . Occupation: Clinical research associate  Tobacco Use  . Smoking status: Never Smoker  . Smokeless tobacco: Never Used  Substance and Sexual Activity  . Alcohol use: No  . Drug use: No  . Sexual activity: Not Currently    Birth control/protection: None, Post-menopausal  Other Topics Concern  . Not on file  Social History Narrative  . Not on file   Social Determinants of Health   Financial Resource  Strain:   . Difficulty of Paying Living Expenses:   Food Insecurity:   . Worried About Charity fundraiser in the Last Year:   . Arboriculturist in the Last Year:   Transportation Needs:   . Film/video editor (Medical):   Marland Kitchen Lack of Transportation (Non-Medical):   Physical Activity:   . Days of Exercise per Week:   . Minutes of Exercise per Session:   Stress:   . Feeling of Stress :   Social Connections:   . Frequency of Communication with Friends and Family:   . Frequency of Social Gatherings with Friends and Family:   . Attends Religious Services:   . Active Member of Clubs or Organizations:   . Attends Archivist Meetings:   Marland Kitchen Marital Status:   Intimate Partner Violence:   . Fear of Current or Ex-Partner:   . Emotionally Abused:   Marland Kitchen Physically Abused:   . Sexually Abused:     Allergies:  No Known Allergies  Medications:      Physical Exam Vitals: BP 112/76   Ht 5' 1"  (1.549 m)   Wt 107 lb (48.5 kg)   LMP 09/03/2014   BMI 20.22 kg/m   General: BF in NAD HEENT: normocephalic, anicteric Neck: no thyroid enlargement, no palpable nodules, no cervical lymphadenopathy  Pulmonary: No increased work of breathing, CTAB Cardiovascular: RRR, without murmur  Breast: Breast symmetrical, no tenderness, no palpable nodules or masses, no skin or nipple retraction present, no nipple discharge.  No axillary, infraclavicular or supraclavicular lymphadenopathy. Abdomen: Soft, non-tender, non-distended.  Umbilicus without lesions.  No hepatomegaly or masses palpable. No evidence of hernia. Genitourinary:  External: Normal external female genitalia.  Normal urethral meatus, normal Bartholin's and Skene's glands.    Vagina: Normal vaginal mucosa, no evidence of prolapse.    Cervix: Grossly normal in appearance, posterior, non-tender  Uterus: Anteverted, normal size, shape, and consistency, mobile, and non-tender  Adnexa: No adnexal masses, non-tender  Rectal:  deferred  Lymphatic: no evidence of inguinal lymphadenopathy Extremities: no edema, erythema, or tenderness Neurologic: Grossly intact Psychiatric: mood appropriate, affect full     Assessment: 49 y.o. G1P1 normal postmenopausal gyn exam  Plan:   1) Breast cancer screening - recommend monthly self breast exam monthly and continued annual mammograms. Mammogram is UTD.   2) Cervical cancer screening - Pap was not done. Desires Pap smear every 3 years. Next due 2022  3) Contraception -not needed  4) Routine healthcare maintenance including cholesterol and diabetes screening managed by PCP . DIscussed calcium and vitamin D3 requirements and encouraged exercises to help prevent osteoporosis  5) Colon cancer screening: discussed colon cancer screening options (colonoscopy, Cologuard, FIT test) and patient is interested in Cologuard. To check with insurance to see if covered. To let me know if she desires test.   6) RTO 1 year and prn  Dalia Heading, CNM

## 2020-03-16 ENCOUNTER — Telehealth: Payer: Self-pay

## 2020-03-16 NOTE — Telephone Encounter (Signed)
Patient needs her immunization records. Inquiring if we can provide those for her. Cb#[id number] ok to leave message.

## 2020-03-16 NOTE — Telephone Encounter (Signed)
Spoke w/patient. Verbal given of vaccines on file (Flu 2018,2019,2020), TDAP 01-2017. Advised should be able to access this thru my chart. Assisted with how to login to my chart.

## 2020-05-26 ENCOUNTER — Other Ambulatory Visit: Payer: Self-pay | Admitting: Advanced Practice Midwife

## 2020-05-26 DIAGNOSIS — Z1231 Encounter for screening mammogram for malignant neoplasm of breast: Secondary | ICD-10-CM

## 2020-07-26 ENCOUNTER — Ambulatory Visit (INDEPENDENT_AMBULATORY_CARE_PROVIDER_SITE_OTHER): Payer: BC Managed Care – PPO | Admitting: Advanced Practice Midwife

## 2020-07-26 ENCOUNTER — Other Ambulatory Visit (HOSPITAL_COMMUNITY)
Admission: RE | Admit: 2020-07-26 | Discharge: 2020-07-26 | Disposition: A | Discharge status: Home / Self Care | Payer: BC Managed Care – PPO | Source: Ambulatory Visit | Attending: Advanced Practice Midwife | Admitting: Advanced Practice Midwife

## 2020-07-26 ENCOUNTER — Encounter: Payer: Self-pay | Admitting: Advanced Practice Midwife

## 2020-07-26 ENCOUNTER — Other Ambulatory Visit: Payer: Self-pay

## 2020-07-26 ENCOUNTER — Ambulatory Visit
Admission: RE | Admit: 2020-07-26 | Discharge: 2020-07-26 | Disposition: A | Discharge status: Home / Self Care | Payer: BC Managed Care – PPO | Source: Ambulatory Visit | Attending: Advanced Practice Midwife | Admitting: Advanced Practice Midwife

## 2020-07-26 VITALS — BP 120/80 | Ht 61.0 in | Wt 117.0 lb

## 2020-07-26 DIAGNOSIS — Z124 Encounter for screening for malignant neoplasm of cervix: Secondary | ICD-10-CM

## 2020-07-26 DIAGNOSIS — Z01419 Encounter for gynecological examination (general) (routine) without abnormal findings: Secondary | ICD-10-CM | POA: Diagnosis not present

## 2020-07-26 DIAGNOSIS — Z1231 Encounter for screening mammogram for malignant neoplasm of breast: Secondary | ICD-10-CM

## 2020-07-26 NOTE — Patient Instructions (Signed)
Health Maintenance, Female Adopting a healthy lifestyle and getting preventive care are important in promoting health and wellness. Ask your health care provider about:  The right schedule for you to have regular tests and exams.  Things you can do on your own to prevent diseases and keep yourself healthy. What should I know about diet, weight, and exercise? Eat a healthy diet  Eat a diet that includes plenty of vegetables, fruits, low-fat dairy products, and lean protein.  Do not eat a lot of foods that are high in solid fats, added sugars, or sodium.   Maintain a healthy weight Body mass index (BMI) is used to identify weight problems. It estimates body fat based on height and weight. Your health care provider can help determine your BMI and help you achieve or maintain a healthy weight. Get regular exercise Get regular exercise. This is one of the most important things you can do for your health. Most adults should:  Exercise for at least 150 minutes each week. The exercise should increase your heart rate and make you sweat (moderate-intensity exercise).  Do strengthening exercises at least twice a week. This is in addition to the moderate-intensity exercise.  Spend less time sitting. Even light physical activity can be beneficial. Watch cholesterol and blood lipids Have your blood tested for lipids and cholesterol at 50 years of age, then have this test every 5 years. Have your cholesterol levels checked more often if:  Your lipid or cholesterol levels are high.  You are older than 50 years of age.  You are at high risk for heart disease. What should I know about cancer screening? Depending on your health history and family history, you may need to have cancer screening at various ages. This may include screening for:  Breast cancer.  Cervical cancer.  Colorectal cancer.  Skin cancer.  Lung cancer. What should I know about heart disease, diabetes, and high blood  pressure? Blood pressure and heart disease  High blood pressure causes heart disease and increases the risk of stroke. This is more likely to develop in people who have high blood pressure readings, are of African descent, or are overweight.  Have your blood pressure checked: ? Every 3-5 years if you are 18-39 years of age. ? Every year if you are 40 years old or older. Diabetes Have regular diabetes screenings. This checks your fasting blood sugar level. Have the screening done:  Once every three years after age 40 if you are at a normal weight and have a low risk for diabetes.  More often and at a younger age if you are overweight or have a high risk for diabetes. What should I know about preventing infection? Hepatitis B If you have a higher risk for hepatitis B, you should be screened for this virus. Talk with your health care provider to find out if you are at risk for hepatitis B infection. Hepatitis C Testing is recommended for:  Everyone born from 1945 through 1965.  Anyone with known risk factors for hepatitis C. Sexually transmitted infections (STIs)  Get screened for STIs, including gonorrhea and chlamydia, if: ? You are sexually active and are younger than 50 years of age. ? You are older than 50 years of age and your health care provider tells you that you are at risk for this type of infection. ? Your sexual activity has changed since you were last screened, and you are at increased risk for chlamydia or gonorrhea. Ask your health care provider   if you are at risk.  Ask your health care provider about whether you are at high risk for HIV. Your health care provider may recommend a prescription medicine to help prevent HIV infection. If you choose to take medicine to prevent HIV, you should first get tested for HIV. You should then be tested every 3 months for as long as you are taking the medicine. Pregnancy  If you are about to stop having your period (premenopausal) and  you may become pregnant, seek counseling before you get pregnant.  Take 400 to 800 micrograms (mcg) of folic acid every day if you become pregnant.  Ask for birth control (contraception) if you want to prevent pregnancy. Osteoporosis and menopause Osteoporosis is a disease in which the bones lose minerals and strength with aging. This can result in bone fractures. If you are 65 years old or older, or if you are at risk for osteoporosis and fractures, ask your health care provider if you should:  Be screened for bone loss.  Take a calcium or vitamin D supplement to lower your risk of fractures.  Be given hormone replacement therapy (HRT) to treat symptoms of menopause. Follow these instructions at home: Lifestyle  Do not use any products that contain nicotine or tobacco, such as cigarettes, e-cigarettes, and chewing tobacco. If you need help quitting, ask your health care provider.  Do not use street drugs.  Do not share needles.  Ask your health care provider for help if you need support or information about quitting drugs. Alcohol use  Do not drink alcohol if: ? Your health care provider tells you not to drink. ? You are pregnant, may be pregnant, or are planning to become pregnant.  If you drink alcohol: ? Limit how much you use to 0-1 drink a day. ? Limit intake if you are breastfeeding.  Be aware of how much alcohol is in your drink. In the [place] one drink equals one 12 oz bottle of beer (355 mL), one 5 oz glass of wine (148 mL), or one 1 oz glass of hard liquor (44 mL). General instructions  Schedule regular health, dental, and eye exams.  Stay current with your vaccines.  Tell your health care provider if: ? You often feel depressed. ? You have ever been abused or do not feel safe at home. Summary  Adopting a healthy lifestyle and getting preventive care are important in promoting health and wellness.  Follow your health care provider's instructions about healthy  diet, exercising, and getting tested or screened for diseases.  Follow your health care provider's instructions on monitoring your cholesterol and blood pressure. This information is not intended to replace advice given to you by your health care provider. Make sure you discuss any questions you have with your health care provider. Document Revised: 03/13/2018 Document Reviewed: 03/13/2018 Elsevier Patient Education  2021 Elsevier Inc.  

## 2020-07-26 NOTE — Progress Notes (Signed)
Gynecology Annual Exam  PCP: Lennie Odor, [place]  Chief Complaint:  Chief Complaint  Patient presents with  . Annual Exam    History of Present Illness: Patient is a 50 y.o. Armstrong presents for annual exam. The patient has no complaints today.   LMP: Patient's last menstrual period was 09/03/2014. No postcoital bleeding   The patient is occasionally sexually active. She currently uses postmenopausal status for contraception. She denies dyspareunia.  The patient does perform self breast exams.  There is no notable family history of breast or ovarian cancer in her family.  The patient wears seatbelts: yes.   The patient has regular exercise: she walks regularly, she admits eating some fast food and needs to increase hydration with water. Her amount of sleep has improved recently. She is working full time, finishing her doctorate- she thinks next spring- and takes care of her 17 year old daughter.    The patient denies current symptoms of depression.    Review of Systems: Review of Systems  Constitutional: Negative for chills and fever.  HENT: Negative for congestion, ear discharge, ear pain, hearing loss, sinus pain and sore throat.   Eyes: Negative for blurred vision and double vision.  Respiratory: Negative for cough, shortness of breath and wheezing.   Cardiovascular: Negative for chest pain, palpitations and leg swelling.  Gastrointestinal: Negative for abdominal pain, blood in stool, constipation, diarrhea, heartburn, melena, nausea and vomiting.  Genitourinary: Negative for dysuria, flank pain, frequency, hematuria and urgency.  Musculoskeletal: Negative for back pain, joint pain and myalgias.  Skin: Negative for itching and rash.  Neurological: Negative for dizziness, tingling, tremors, sensory change, speech change, focal weakness, seizures, loss of consciousness, weakness and headaches.  Endo/Heme/Allergies: Negative for environmental allergies. Does not bruise/bleed  easily.  Psychiatric/Behavioral: Negative for depression, hallucinations, memory loss, substance abuse and suicidal ideas. The patient is not nervous/anxious and does not have insomnia.   Breast: Positive for occasional tenderness  Past Medical History:  Patient Active Problem List   Diagnosis Date Noted  . 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 Diffuse cystic mastopathy 04/04/2011    Past Surgical History:  Past Surgical History:  Procedure Laterality Date  . BREAST EXCISIONAL BIOPSY Left   . BREAST SURGERY Left 2010   FNA and left breast mass excision  . CESAREAN SECTION  2012  . WISDOM TOOTH EXTRACTION      Gynecologic History:  Patient's last menstrual period was 09/03/2014. Contraception: post menopausal status Last Pap: 2019 Results were:  no abnormalities  Last mammogram: 2021 Results were: BI-RAD I, had mammogram today  Obstetric History: Armstrong  Family History:  Family History  Problem Relation Age of Onset  . Congestive Heart Failure Mother   . Hypertension Mother   . Hypertension Sister   . Sickle cell anemia Sister   . Hypertension Brother   . Cancer Neg Hx     Social History:  Social History   Socioeconomic History  . Marital status: Married    Spouse name: Not on file  . Number of children: 1  . Years of education: Not on file  . Highest education level: Master's degree (e.g., MA, MS, MEng, MEd, MSW, MBA)  Occupational History  . Occupation: Clinical research associate  Tobacco Use  . Smoking status: Never Smoker  . Smokeless tobacco: Never Used  Vaping Use  . Vaping Use: Never used  Substance and Sexual Activity  . Alcohol use: No  . Drug use: No  .  Sexual activity: Not Currently    Birth control/protection: None, Post-menopausal  Other Topics Concern  . Not on file  Social History Narrative  . Not on file   Social Determinants of Health   Financial Resource Strain: Not on file  Food Insecurity: Not on file   Transportation Needs: Not on file  Physical Activity: Not on file  Stress: Not on file  Social Connections: Not on file  Intimate Partner Violence: Not on file    Allergies:  No Known Allergies  Medications: Prior to Admission medications   Not on File    Physical Exam Vitals: Blood pressure 120/80, height 5' 1"  (1.549 m), weight 117 lb (53.1 kg), last menstrual period 09/03/2014.  General: NAD HEENT: normocephalic, anicteric Thyroid: no enlargement, no palpable nodules Pulmonary: No increased work of breathing, CTAB Cardiovascular: RRR, distal pulses 2+ Breast: Breast symmetrical, no tenderness, no palpable nodules or masses, no skin or nipple retraction present, no nipple discharge.  No axillary or supraclavicular lymphadenopathy. Abdomen: NABS, soft, non-tender, non-distended.  Umbilicus without lesions.  No hepatomegaly, splenomegaly or masses palpable. No evidence of hernia  Genitourinary:  External: Normal external female genitalia.  Normal urethral meatus, normal Bartholin's and Skene's glands.    Vagina: Normal vaginal mucosa, no evidence of prolapse.    Cervix: Grossly normal in appearance, no bleeding, no CMT  Uterus: deferred  Adnexa: deferred  Rectal: deferred  Lymphatic: no evidence of inguinal lymphadenopathy Extremities: no edema, erythema, or tenderness Neurologic: Grossly intact Psychiatric: mood appropriate, affect full    Assessment: 50 y.o. Armstrong routine annual exam  Plan: Problem List Items Addressed This Visit   None   Visit Diagnoses    Well woman exam with routine gynecological exam    -  Primary   Relevant Orders   Cytology - PAP   Screening for cervical cancer       Relevant Orders   Cytology - PAP      1) Mammogram - recommend yearly screening mammogram.  Mammogram Is up to date  2) STI screening  was offered and declined  3) ASCCP guidelines and rationale discussed.  Patient opts for every 3 years screening interval  4)  Contraception - the patient is currently using  post menopausal status.  She is happy with her current form of contraception and plans to continue  5) Colonoscopy: patient had Cologuard done in the past few years -- Screening recommended starting at age 33 for average risk individuals, age 65 for individuals deemed at increased risk (including African Americans) and recommended to continue until age 102.  For patient age 67-Deborah individualized approach is recommended.  Gold standard screening is via colonoscopy, Cologuard screening is an acceptable alternative for patient unwilling or unable to undergo colonoscopy.  "Colorectal cancer screening for average?risk adults: 2018 guideline update from the American Cancer Society"CA: A Cancer Journal for Clinicians: Aug 30, 2016   6) Routine healthcare maintenance including cholesterol, diabetes screening discussed managed by PCP   7) Increase healthy lifestyle; diet, hydration  8) Return in about 1 year (around 07/26/2021) for annual established gyn.   Rod Can, [facility] 07/26/2020, 2:18 PM

## 2020-08-02 LAB — CYTOLOGY - PAP
Comment: NEGATIVE
Comment: NEGATIVE
Diagnosis: NEGATIVE
HPV 16: NEGATIVE
HPV 18 / 45: NEGATIVE
High risk HPV: POSITIVE — AB

## 2020-08-06 ENCOUNTER — Telehealth: Payer: Self-pay

## 2020-08-06 NOTE — Telephone Encounter (Signed)
Pt calling; needs her pap results explained to her - positive for High Risk HPV; what does it mean?  [phone]

## 2020-08-07 NOTE — Telephone Encounter (Signed)
Spoke with patient and reviewed PAP result and recommendation to repeat PAP in 1 year

## 2021-06-16 ENCOUNTER — Other Ambulatory Visit: Payer: Self-pay | Admitting: Advanced Practice Midwife

## 2021-08-03 ENCOUNTER — Ambulatory Visit: Payer: BC Managed Care – PPO

## 2021-08-12 ENCOUNTER — Ambulatory Visit
Admission: RE | Admit: 2021-08-12 | Discharge: 2021-08-12 | Disposition: A | Discharge status: Home / Self Care | Payer: BC Managed Care – PPO | Source: Ambulatory Visit | Attending: Advanced Practice Midwife | Admitting: Advanced Practice Midwife

## 2021-08-12 DIAGNOSIS — Z1231 Encounter for screening mammogram for malignant neoplasm of breast: Secondary | ICD-10-CM

## 2021-11-15 ENCOUNTER — Encounter: Payer: Self-pay | Admitting: Obstetrics & Gynecology

## 2021-11-15 ENCOUNTER — Ambulatory Visit (INDEPENDENT_AMBULATORY_CARE_PROVIDER_SITE_OTHER): Payer: BC Managed Care – PPO | Admitting: Obstetrics & Gynecology

## 2021-11-15 VITALS — BP 100/60 | Ht 61.0 in | Wt 115.0 lb

## 2021-11-15 DIAGNOSIS — Z124 Encounter for screening for malignant neoplasm of cervix: Secondary | ICD-10-CM

## 2021-11-15 DIAGNOSIS — Z01419 Encounter for gynecological examination (general) (routine) without abnormal findings: Secondary | ICD-10-CM | POA: Diagnosis not present

## 2021-11-15 DIAGNOSIS — B977 Papillomavirus as the cause of diseases classified elsewhere: Secondary | ICD-10-CM | POA: Diagnosis not present

## 2021-11-15 NOTE — Progress Notes (Signed)
Subjective:    Deborah Armstrong is a 51 y.o. separated P20 (66 yo daughter) who presents for an annual exam. The patient has no complaints today. The patient is not currently sexually active. GYN screening history: last pap: was abnormal: + HR HPV . The patient wears seatbelts: yes. The patient participates in regular exercise: no. Has the patient ever been transfused or tattooed?: yes. The patient reports that there is not domestic violence in her life.   Menstrual History: OB History     Gravida  1   Para  1   Term  1   Preterm      AB      Living  1      SAB      IAB      Ectopic      Multiple      Live Births  1        Obstetric Comments  Age with first menstruation-12 Age with first pregnancy-40        Patient's last menstrual period was 08/26/2014.    The following portions of the patient's history were reviewed and updated as appropriate: allergies, current medications, past family history, past medical history, past social history, past surgical history, and problem list.  Review of Systems Pertinent items are noted in HPI.  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 Mammogram 08/2021   Objective:    BP 100/60   Ht 5\' 1"  (1.549 m)   Wt 115 lb (52.2 kg)   LMP 08/26/2014   BMI 21.73 kg/m   General Appearance:    Alert, cooperative, no distress, appears stated age  Head:    Normocephalic, without obvious abnormality, atraumatic  Eyes:    PERRL, conjunctiva/corneas clear, EOM's intact, fundi    benign, both eyes  Ears:    Normal TM's and external ear canals, both ears  Nose:   Nares normal, septum midline, mucosa normal, no drainage    or sinus tenderness  Throat:   Lips, mucosa, and tongue normal; teeth and gums normal  Neck:   Supple, symmetrical, trachea midline, no adenopathy;    thyroid:  no enlargement/tenderness/nodules; no carotid    bruit or JVD  Back:     Symmetric, no curvature, ROM normal, no CVA tenderness  Lungs:     Clear to auscultation bilaterally, respirations unlabored  Chest Wall:    No tenderness or deformity   Heart:    Regular rate and rhythm, S1 and S2 normal, no murmur, rub   or gallop  Breast Exam:    No tenderness, masses, or nipple abnormality  Abdomen:     Soft, non-tender, bowel sounds active all four quadrants,    no masses, no organomegaly  Genitalia:    Normal female without lesion, discharge or tenderness, normal size and shape, anteverted uterus, normal adnexal exam      Extremities:   Extremities normal, atraumatic, no cyanosis or edema  Pulses:   2+ and symmetric all extremities  Skin:   Skin color, texture, turgor normal, no rashes or lesions  Lymph nodes:   Cervical, supraclavicular, and axillary nodes normal  Neurologic:   CNII-XII intact, normal strength, sensation and reflexes    throughout  .    Assessment:    Healthy female exam.  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

## 2021-11-21 LAB — CYTOLOGY - PAP: Adequacy: ABNORMAL

## 2021-11-22 ENCOUNTER — Encounter: Payer: Self-pay | Admitting: Obstetrics & Gynecology

## 2022-01-02 IMAGING — MG MM DIGITAL SCREENING BILAT W/ TOMO AND CAD
6 of 10 series · 6 of 30 positions shown · non-contrast
Comparison: Previous exam(s).

CLINICAL DATA: Screening.

EXAM:
DIGITAL SCREENING BILATERAL MAMMOGRAM WITH TOMOSYNTHESIS AND CAD
TECHNIQUE: Bilateral screening digital craniocaudal and mediolateral oblique
mammograms were obtained. Bilateral screening digital breast
tomosynthesis was performed. The images were evaluated with
computer-aided detection.

[R MLO synth-2D]
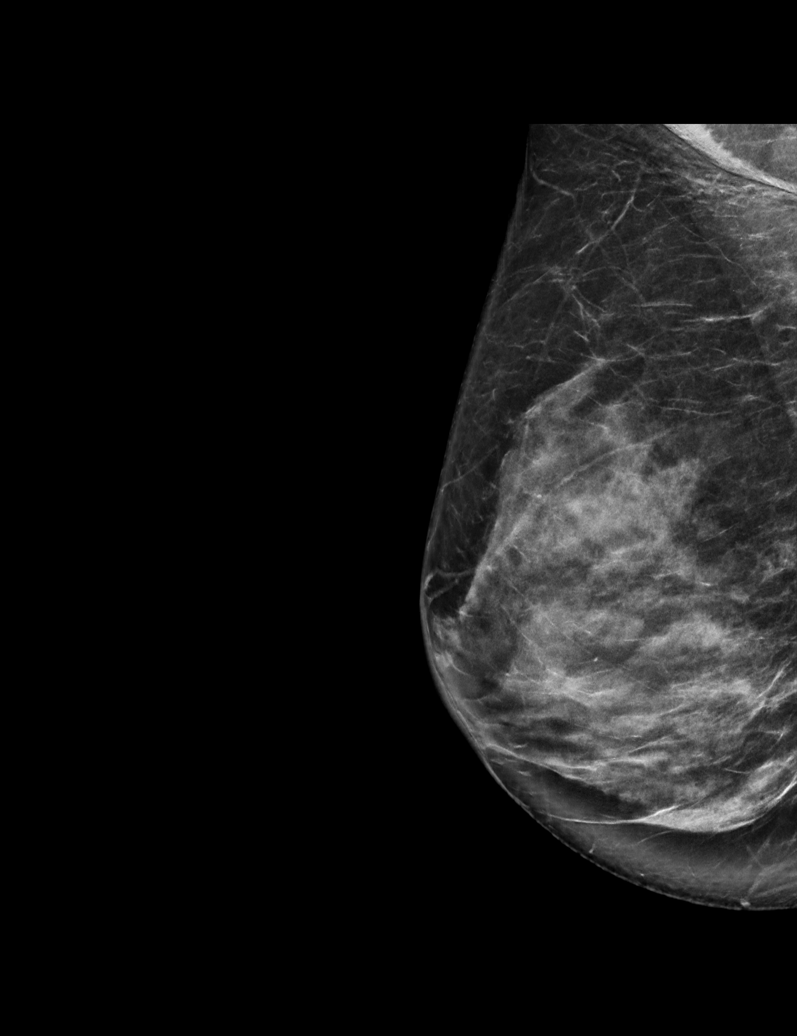

[L MLO synth-2D (1 of 2)]
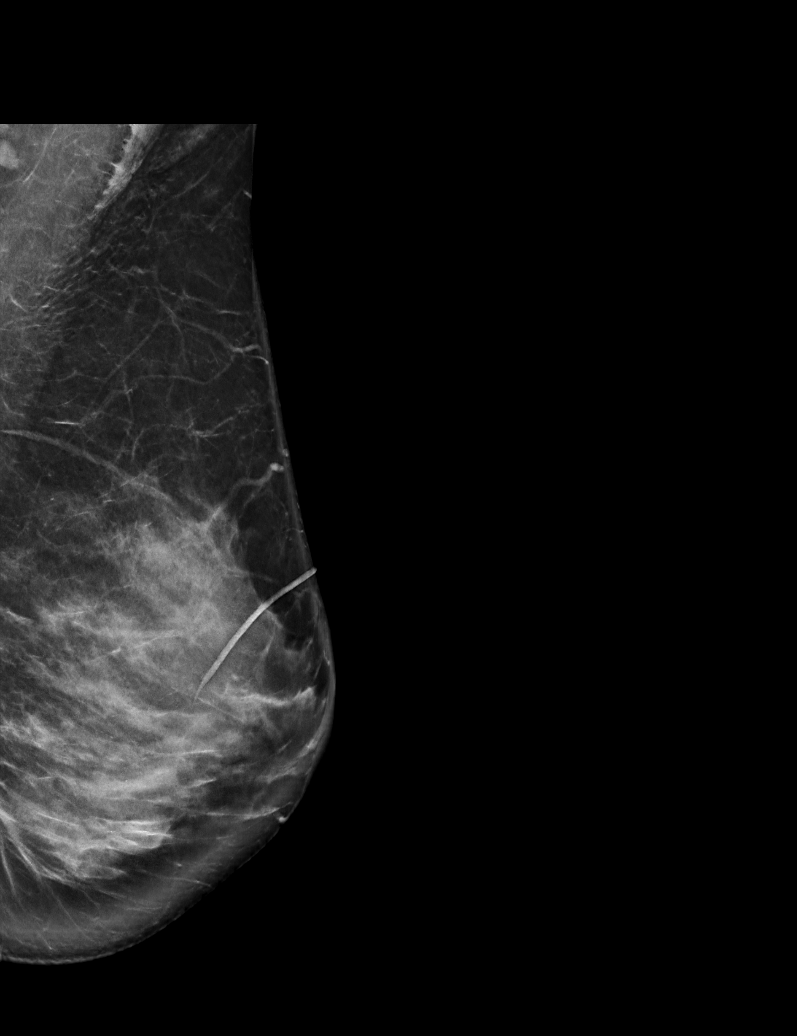

[L MLO synth-2D (2 of 2)]
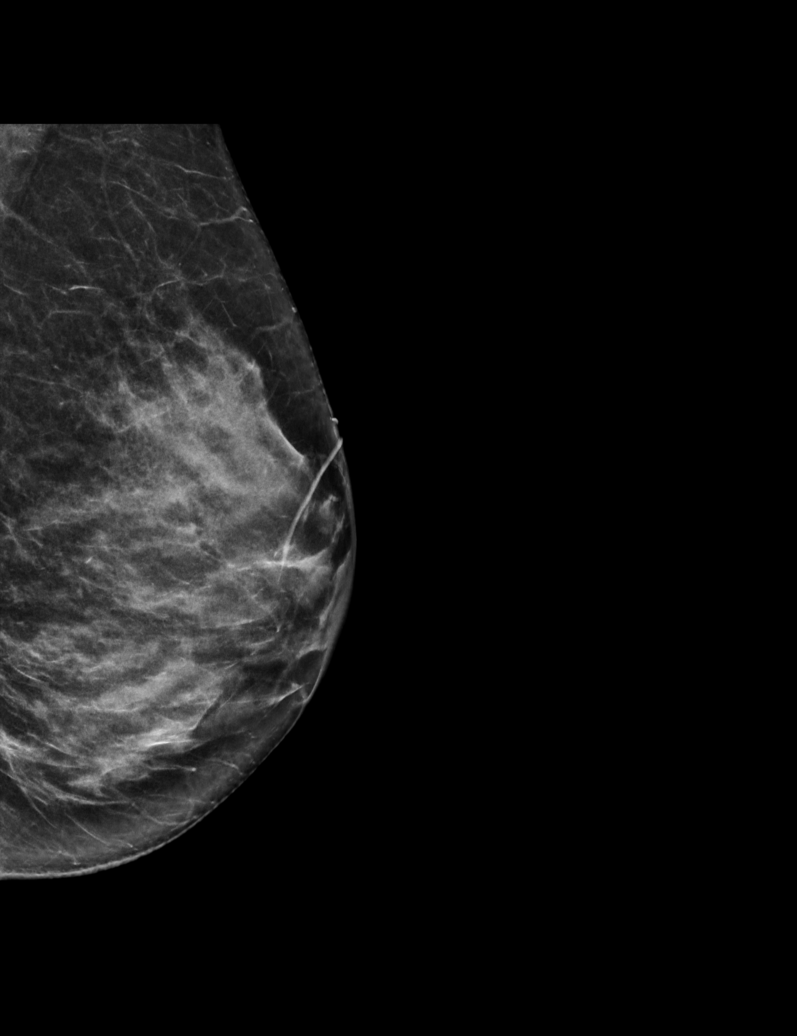

[R CC synth-2D]
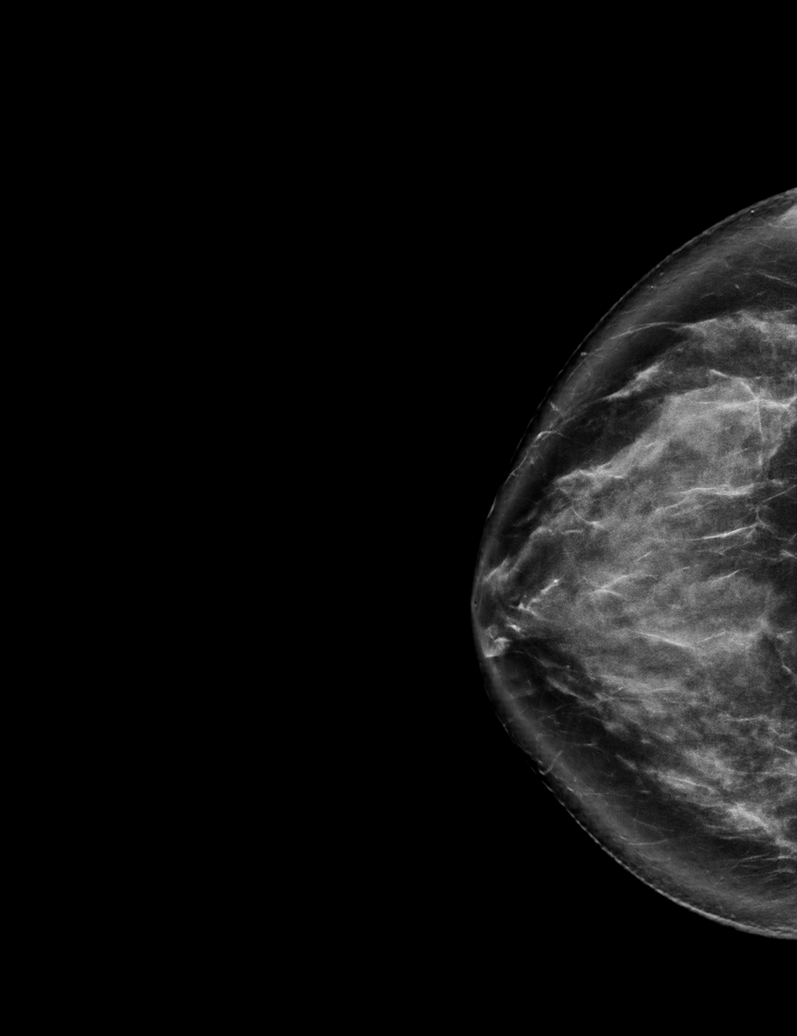

[L CC synth-2D]
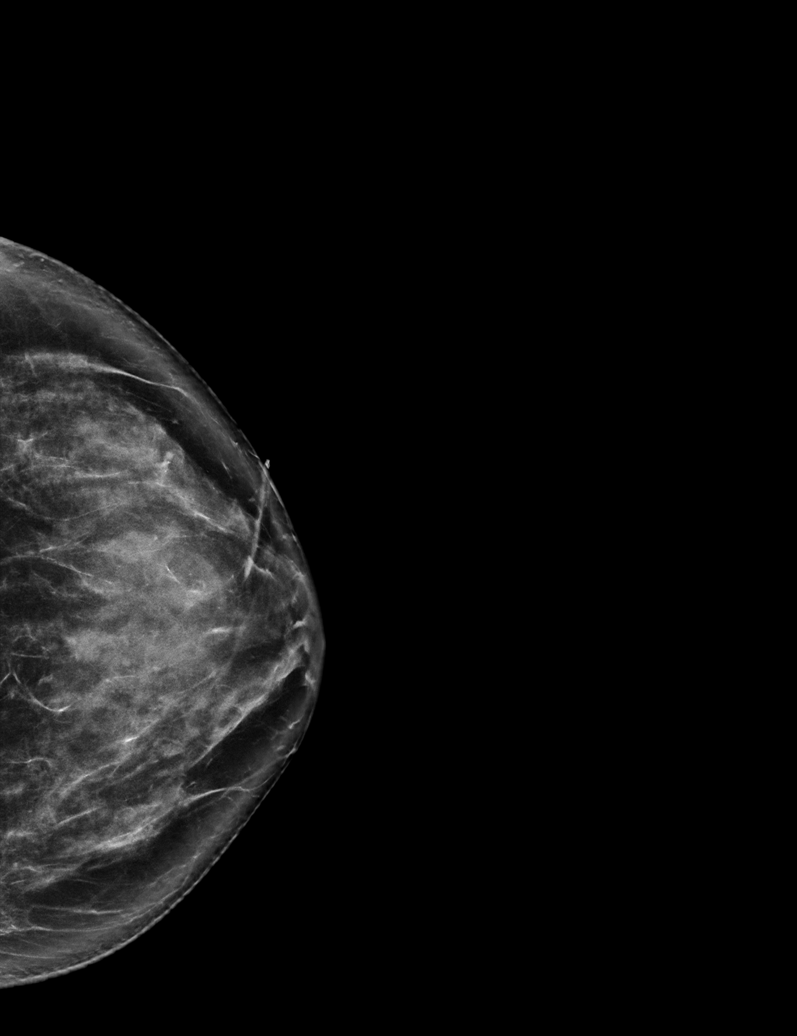

[L MLO tomo · tomo slice 41/82.0]
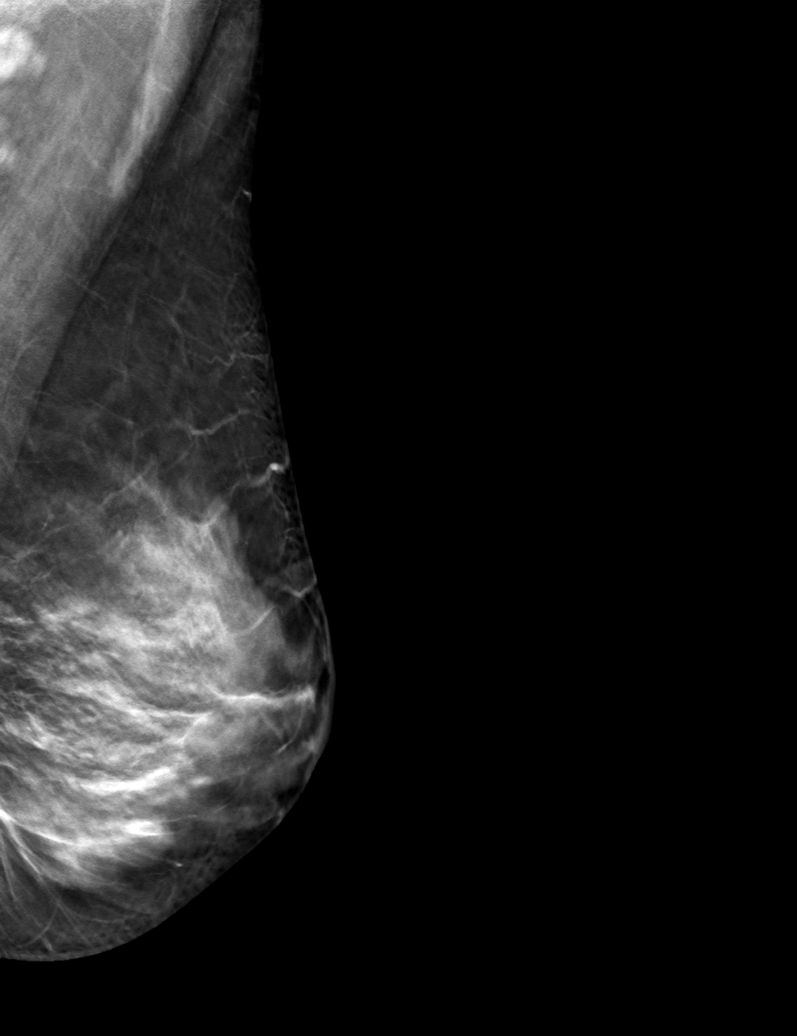

[6 of 30 positions shown; findings below may reference images not displayed]

ACR Breast Density Category c: The breast tissue is heterogeneously
dense, which may obscure small masses.
FINDINGS: There are no findings suspicious for malignancy. The images were
evaluated with computer-aided detection.
IMPRESSION: No mammographic evidence of malignancy. A result letter of this
screening mammogram will be mailed directly to the patient.

RECOMMENDATION:
Screening mammogram in one year. (Code:T4-5-GWO)

BI-RADS CATEGORY  1: Negative.

## 2022-07-17 ENCOUNTER — Other Ambulatory Visit: Payer: Self-pay | Admitting: Advanced Practice Midwife

## 2022-07-17 DIAGNOSIS — Z1231 Encounter for screening mammogram for malignant neoplasm of breast: Secondary | ICD-10-CM

## 2022-08-25 ENCOUNTER — Ambulatory Visit
Admission: RE | Admit: 2022-08-25 | Discharge: 2022-08-25 | Disposition: A | Discharge status: Home / Self Care | Payer: BC Managed Care – PPO | Source: Ambulatory Visit | Attending: Advanced Practice Midwife | Admitting: Advanced Practice Midwife

## 2022-08-25 DIAGNOSIS — Z1231 Encounter for screening mammogram for malignant neoplasm of breast: Secondary | ICD-10-CM

## 2022-11-24 DIAGNOSIS — Z131 Encounter for screening for diabetes mellitus: Secondary | ICD-10-CM | POA: Diagnosis not present

## 2022-11-24 DIAGNOSIS — Z0000 Encounter for general adult medical examination without abnormal findings: Secondary | ICD-10-CM | POA: Diagnosis not present

## 2022-12-08 DIAGNOSIS — J0181 Other acute recurrent sinusitis: Secondary | ICD-10-CM | POA: Diagnosis not present

## 2022-12-08 DIAGNOSIS — J343 Hypertrophy of nasal turbinates: Secondary | ICD-10-CM | POA: Diagnosis not present

## 2022-12-08 DIAGNOSIS — J342 Deviated nasal septum: Secondary | ICD-10-CM | POA: Diagnosis not present

## 2022-12-08 DIAGNOSIS — R0989 Other specified symptoms and signs involving the circulatory and respiratory systems: Secondary | ICD-10-CM | POA: Diagnosis not present

## 2022-12-08 DIAGNOSIS — R0981 Nasal congestion: Secondary | ICD-10-CM | POA: Diagnosis not present

## 2023-01-05 DIAGNOSIS — H1045 Other chronic allergic conjunctivitis: Secondary | ICD-10-CM | POA: Diagnosis not present

## 2023-02-19 DIAGNOSIS — M9905 Segmental and somatic dysfunction of pelvic region: Secondary | ICD-10-CM | POA: Diagnosis not present

## 2023-02-19 DIAGNOSIS — M9902 Segmental and somatic dysfunction of thoracic region: Secondary | ICD-10-CM | POA: Diagnosis not present

## 2023-02-19 DIAGNOSIS — M9903 Segmental and somatic dysfunction of lumbar region: Secondary | ICD-10-CM | POA: Diagnosis not present

## 2023-02-19 DIAGNOSIS — M6019 Interstitial myositis, multiple sites: Secondary | ICD-10-CM | POA: Diagnosis not present

## 2023-03-10 DIAGNOSIS — M5386 Other specified dorsopathies, lumbar region: Secondary | ICD-10-CM | POA: Diagnosis not present

## 2023-03-10 DIAGNOSIS — M9903 Segmental and somatic dysfunction of lumbar region: Secondary | ICD-10-CM | POA: Diagnosis not present

## 2023-03-10 DIAGNOSIS — M6019 Interstitial myositis, multiple sites: Secondary | ICD-10-CM | POA: Diagnosis not present

## 2023-03-10 DIAGNOSIS — M9905 Segmental and somatic dysfunction of pelvic region: Secondary | ICD-10-CM | POA: Diagnosis not present

## 2023-04-26 ENCOUNTER — Ambulatory Visit (INDEPENDENT_AMBULATORY_CARE_PROVIDER_SITE_OTHER): Payer: BC Managed Care – PPO | Admitting: Advanced Practice Midwife

## 2023-04-26 ENCOUNTER — Encounter: Payer: Self-pay | Admitting: Advanced Practice Midwife

## 2023-04-26 VITALS — BP 102/62 | HR 91 | Ht 61.0 in | Wt 123.0 lb

## 2023-04-26 DIAGNOSIS — L659 Nonscarring hair loss, unspecified: Secondary | ICD-10-CM

## 2023-04-26 DIAGNOSIS — R6882 Decreased libido: Secondary | ICD-10-CM

## 2023-04-26 DIAGNOSIS — N941 Unspecified dyspareunia: Secondary | ICD-10-CM

## 2023-04-26 DIAGNOSIS — Z1321 Encounter for screening for nutritional disorder: Secondary | ICD-10-CM

## 2023-04-26 DIAGNOSIS — R6889 Other general symptoms and signs: Secondary | ICD-10-CM

## 2023-04-26 DIAGNOSIS — N951 Menopausal and female climacteric states: Secondary | ICD-10-CM | POA: Diagnosis not present

## 2023-04-26 MED ORDER — NORETHINDRONE-ETH ESTRADIOL 0.5-2.5 MG-MCG PO TABS: 1.0000 | ORAL_TABLET | Freq: Every day | ORAL | 11 refills | Status: DC

## 2023-04-27 ENCOUNTER — Encounter: Payer: Self-pay | Admitting: Advanced Practice Midwife

## 2023-04-27 LAB — PROGESTERONE: Progesterone: <0.1 ng/mL

## 2023-04-27 LAB — THYROID PANEL WITH TSH
Free Thyroxine Index: 2.5 (ref 1.2–4.9)
T3 Uptake Ratio: 30 % (ref 24–39)
T4, Total: 8.4 ug/dL (ref 4.5–12.0)
TSH: 0.901 u[IU]/mL (ref 0.450–4.500)

## 2023-04-27 LAB — FSH/LH
FSH: 130 m[IU]/mL
LH: 48 m[IU]/mL

## 2023-04-27 LAB — ESTRADIOL: Estradiol: 5.8 pg/mL

## 2023-04-27 LAB — VITAMIN D 25 HYDROXY (VIT D DEFICIENCY, FRACTURES): Vit D, 25-Hydroxy: 31.9 ng/mL (ref 30.0–100.0)

## 2023-04-27 NOTE — Progress Notes (Signed)
Patient ID: Makayla Lanter, female   DOB: 12-05-1970, 53 y.o.   MRN: [id number]  Reason for Visit: Menopause (Hair loss, forgetful, slight low sex drive)   Subjective:  Date of Service: 04/26/2023  Zyanna Leisinger Campise is a 53 y.o. female being seen for several concerns. She has recently had an increase of hair loss- at times coming out in clumps. She has stopped using hair relaxers. She reports her stress level has decreased in the last couple of years. She is following a healthy lifestyle; diet, hydration, sleep. She also has complaints of forgetfulness, low sex drive, some pain with intercourse and mild hot flashes. We discussed probably symptoms of menopause and she is interested in trying HRT. We also discussed doing lab work today- thyroid, hormones, vitamin D. Recommended some supplements that may be helpful for hair loss and to follow up with dermatologist.   Past Medical History:  Diagnosis Date   Diffuse cystic mastopathy 2013   Menopause    age 49   Family History  Problem Relation Age of Onset   Congestive Heart Failure Mother    Hypertension Mother    Hypertension Sister    Sickle cell anemia Sister    Hypertension Brother    Cancer Neg Hx    Past Surgical History:  Procedure Laterality Date   BREAST EXCISIONAL BIOPSY Left    BREAST SURGERY Left 2010   FNA and left breast mass excision   CESAREAN SECTION  2012   WISDOM TOOTH EXTRACTION      Short Social History:  Social History   Tobacco Use   Smoking status: Never   Smokeless tobacco: Never  Substance Use Topics   Alcohol use: Yes    Comment: soc    No Known Allergies  Current Outpatient Medications  Medication Sig Dispense Refill   norethindrone-ethinyl estradiol (FEMHRT LOW DOSE) 0.5-2.5 MG-MCG tablet Take 1 tablet by mouth daily. 30 tablet 11   No current facility-administered medications for this visit.    Review of Systems  Constitutional:  Negative for chills and fever.       Positive  for hair loss, forgetfulness  HENT:  Negative for congestion, ear discharge, ear pain, hearing loss, sinus pain and sore throat.   Eyes:  Negative for blurred vision and double vision.  Respiratory:  Negative for cough, shortness of breath and wheezing.   Cardiovascular:  Negative for chest pain, palpitations and leg swelling.  Gastrointestinal:  Negative for abdominal pain, blood in stool, constipation, diarrhea, heartburn, melena, nausea and vomiting.  Genitourinary:  Negative for dysuria, flank pain, frequency, hematuria and urgency.       Positive for pain with intercourse  Musculoskeletal:  Negative for back pain, joint pain and myalgias.  Skin:  Negative for itching and rash.  Neurological:  Negative for dizziness, tingling, tremors, sensory change, speech change, focal weakness, seizures, loss of consciousness, weakness and headaches.  Endo/Heme/Allergies:  Negative for environmental allergies. Does not bruise/bleed easily.       Positive for low sex drive, hot flashes  Psychiatric/Behavioral:  Negative for depression, hallucinations, memory loss, substance abuse and suicidal ideas. The patient is not nervous/anxious and does not have insomnia.        Objective:  Objective   Vitals:   04/26/23 1123  BP: 102/62  Pulse: 91  Weight: 123 lb (55.8 kg)  Height: 5\' 1"  (1.549 m)   Body mass index is 23.24 kg/m. Constitutional: Well nourished, well developed female in no acute distress.  HEENT: normal Skin: Warm and dry.  Cardiovascular: Regular rate and rhythm.   Extremity:  no edema   Respiratory:  Normal respiratory effort Psych: Alert and Oriented x3. No memory deficits. Normal mood and affect.    Data:  Latest Reference Range & Units 04/26/23 12:11  Vitamin D, 25-Hydroxy 30.0 - 100.0 ng/mL 31.9  LH mIU/mL 48.0  FSH mIU/mL 130.0  Estradiol pg/mL 5.8  Progesterone ng/mL <0.1  TSH 0.450 - 4.500 uIU/mL 0.901  Thyroxine (T4) 4.5 - 12.0 ug/dL 8.4  Free Thyroxine Index 1.2  - 4.9  2.5  T3 Uptake Ratio 24 - 39 % 30       Assessment/Plan:     53 y.o. G1 P1 postmenopausal female with hair loss, menopausal symptoms  Labs: TSH, Estradiol, Progesterone, FSH/LH, Vitamin D Follow up as needed Rx FemHRT for menopause symptoms Consider supplements that may help with hair loss; protein, vitamin C, B vitamins, vitamin D, vitamin A, collagen, biotin, iron, selenium, zinc, omega 3 Follow up with dermatology for other treatment recommendations for hair loss    Tresea Mall, CNM [facility] 04/27/2023 6:06 PM

## 2023-05-06 ENCOUNTER — Encounter: Payer: Self-pay | Admitting: Advanced Practice Midwife

## 2023-05-18 DIAGNOSIS — M6019 Interstitial myositis, multiple sites: Secondary | ICD-10-CM | POA: Diagnosis not present

## 2023-05-18 DIAGNOSIS — M9903 Segmental and somatic dysfunction of lumbar region: Secondary | ICD-10-CM | POA: Diagnosis not present

## 2023-05-18 DIAGNOSIS — M9905 Segmental and somatic dysfunction of pelvic region: Secondary | ICD-10-CM | POA: Diagnosis not present

## 2023-05-18 DIAGNOSIS — M5386 Other specified dorsopathies, lumbar region: Secondary | ICD-10-CM | POA: Diagnosis not present

## 2023-05-24 ENCOUNTER — Encounter: Payer: Self-pay | Admitting: Advanced Practice Midwife

## 2023-05-25 ENCOUNTER — Other Ambulatory Visit: Payer: Self-pay | Admitting: Physician Assistant

## 2023-05-25 DIAGNOSIS — Z1231 Encounter for screening mammogram for malignant neoplasm of breast: Secondary | ICD-10-CM

## 2023-07-05 ENCOUNTER — Other Ambulatory Visit: Payer: Self-pay | Admitting: Advanced Practice Midwife

## 2023-07-05 ENCOUNTER — Telehealth: Payer: Self-pay

## 2023-07-05 DIAGNOSIS — L659 Nonscarring hair loss, unspecified: Secondary | ICD-10-CM

## 2023-07-05 NOTE — Telephone Encounter (Signed)
 Pt calling triage and wants you to put in a referral for Dermatology.

## 2023-07-05 NOTE — Progress Notes (Signed)
 Referral sent for dermatology- hair loss

## 2023-08-28 DIAGNOSIS — H5213 Myopia, bilateral: Secondary | ICD-10-CM | POA: Diagnosis not present

## 2023-08-30 ENCOUNTER — Ambulatory Visit
Admission: RE | Admit: 2023-08-30 | Discharge: 2023-08-30 | Disposition: A | Discharge status: Home / Self Care | Payer: BC Managed Care – PPO | Source: Ambulatory Visit | Attending: Physician Assistant | Admitting: Physician Assistant

## 2023-08-30 DIAGNOSIS — Z1231 Encounter for screening mammogram for malignant neoplasm of breast: Secondary | ICD-10-CM

## 2023-09-04 ENCOUNTER — Other Ambulatory Visit (HOSPITAL_COMMUNITY)
Admission: RE | Admit: 2023-09-04 | Discharge: 2023-09-04 | Disposition: A | Discharge status: Home / Self Care | Source: Ambulatory Visit | Attending: Advanced Practice Midwife | Admitting: Advanced Practice Midwife

## 2023-09-04 ENCOUNTER — Ambulatory Visit (INDEPENDENT_AMBULATORY_CARE_PROVIDER_SITE_OTHER): Admitting: Advanced Practice Midwife

## 2023-09-04 ENCOUNTER — Encounter: Payer: Self-pay | Admitting: Advanced Practice Midwife

## 2023-09-04 VITALS — BP 113/76 | HR 93 | Ht 61.0 in | Wt 127.0 lb

## 2023-09-04 DIAGNOSIS — Z124 Encounter for screening for malignant neoplasm of cervix: Secondary | ICD-10-CM | POA: Diagnosis not present

## 2023-09-04 DIAGNOSIS — N951 Menopausal and female climacteric states: Secondary | ICD-10-CM

## 2023-09-04 DIAGNOSIS — Z8742 Personal history of other diseases of the female genital tract: Secondary | ICD-10-CM

## 2023-09-04 DIAGNOSIS — Z01419 Encounter for gynecological examination (general) (routine) without abnormal findings: Secondary | ICD-10-CM

## 2023-09-04 DIAGNOSIS — N941 Unspecified dyspareunia: Secondary | ICD-10-CM

## 2023-09-04 DIAGNOSIS — Z1211 Encounter for screening for malignant neoplasm of colon: Secondary | ICD-10-CM

## 2023-09-04 MED ORDER — NORETHINDRONE-ETH ESTRADIOL 0.5-2.5 MG-MCG PO TABS: 1.0000 | ORAL_TABLET | Freq: Every day | ORAL | 11 refills | Status: AC

## 2023-09-04 NOTE — Progress Notes (Signed)
 [facility]   Gynecology Annual Exam  PCP: Diamond Formica, [place]  Chief Complaint:  Chief Complaint  Patient presents with   Annual Exam    History of Present Illness:Patient is a 53 y.o. G1P1001 presents for annual exam. The patient has complaint today of generally feeling "blah" for the past 2 months. She also mentions a feeling of bloating especially in the mornings. She has a dermatology appointment to discuss hair loss in December.    LMP: Patient's last menstrual period was 08/26/2014.  Postcoital Bleeding: no Dysmenorrhea: not applicable  The patient is infrequently sexually active. She denies dyspareunia.  The patient does perform self breast exams.  There is no notable family history of breast or ovarian cancer in her family.  The patient wears seatbelts: yes.   The patient has regular exercise: she is trying to increase physical activity, eating a healthy diet and admits adequate hydration and sleep.    The patient denies current symptoms of depression.     Review of Systems: Review of Systems  Constitutional:  Negative for chills and fever.       Positive for decreased energy  HENT:  Negative for congestion, ear discharge, ear pain, hearing loss, sinus pain and sore throat.   Eyes:  Negative for blurred vision and double vision.  Respiratory:  Negative for cough, shortness of breath and wheezing.   Cardiovascular:  Negative for chest pain, palpitations and leg swelling.  Gastrointestinal:  Negative for abdominal pain, blood in stool, constipation, diarrhea, heartburn, melena, nausea and vomiting.       Positive for bloating  Genitourinary:  Negative for dysuria, flank pain, frequency, hematuria and urgency.  Musculoskeletal:  Negative for back pain, joint pain and myalgias.  Skin:  Negative for itching and rash.  Neurological:  Negative for dizziness, tingling, tremors, sensory change, speech change, focal weakness, seizures, loss of consciousness, weakness and  headaches.  Endo/Heme/Allergies:  Negative for environmental allergies. Does not bruise/bleed easily.  Psychiatric/Behavioral:  Negative for depression, hallucinations, memory loss, substance abuse and suicidal ideas. The patient is not nervous/anxious and does not have insomnia.     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 Last Pap: 2023 Results were: non-diagnostic due to scant cellularity. Prior PAP was NILM with positive HR HPV  Last mammogram: 1 week ago Results were: BI-RAD I  Obstetric History: G1P1001  Family History:  Family History  Problem Relation Age of Onset   Congestive Heart Failure Mother    Hypertension Mother    Hypertension Sister    Sickle cell anemia Sister    Hypertension Brother    Cancer Neg Hx     Social History:  Social History   Socioeconomic History   Marital status: Married    Spouse name: Not on file   Number of children: 1   Years of education: Not on file   Highest education level: Master's degree (e.g., MA, MS, MEng, MEd, MSW, MBA)  Occupational  History   Occupation: Retail buyer  Tobacco Use   Smoking status: Never   Smokeless tobacco: Never  Vaping Use   Vaping status: Never Used  Substance and Sexual Activity   Alcohol use: Yes    Comment: soc   Drug use: No   Sexual activity: Yes    Birth control/protection: Post-menopausal  Other Topics Concern   Not on file  Social History Narrative   Not on file   Social Drivers  of Health   Financial Resource Strain: Not on file  Food Insecurity: Not on file  Transportation Needs: Not on file  Physical Activity: Not on file  Stress: Not on file  Social Connections: Unknown (08/14/2021)   Received from [facility]   Social Network    Social Network: Not on file  Intimate Partner Violence: Unknown (07/06/2021)   [facility], [facility]   HITS    Physically Hurt: Not on file    Insult or Talk Down To: Not on file    Threaten Physical Harm: Not on file    Scream or Curse: Not on file    Allergies:  No Known Allergies  Medications: Prior to Admission medications   Medication Sig Start Date End Date Taking? Authorizing Provider  norethindrone -ethinyl estradiol  (FEMHRT LOW DOSE) 0.5-2.5 MG-MCG tablet Take 1 tablet by mouth daily. 09/04/23   Angelita Kendall, CNM    Physical Exam Vitals: Blood pressure 113/76, pulse 93, height 5\' 1"  (1.549 m), weight 127 lb (57.6 kg), last menstrual period 08/26/2014.  General: NAD HEENT: normocephalic, anicteric Thyroid : no enlargement, no palpable nodules Pulmonary: No increased work of breathing, CTAB Cardiovascular: RRR, distal pulses 2+ Breast: Breast symmetrical, no tenderness, no palpable nodules or masses, no skin or nipple retraction present, no nipple discharge.  No axillary or supraclavicular lymphadenopathy. Abdomen: NABS, soft, non-tender, non-distended.  Umbilicus without lesions.  No hepatomegaly, splenomegaly or masses palpable. No evidence of hernia  Genitourinary:  External: Normal external female genitalia.  Normal urethral meatus, normal Bartholin's and Skene's glands.    Vagina: Normal vaginal mucosa, no evidence of prolapse.    Cervix: Grossly normal in appearance, no bleeding  Uterus: Non-enlarged, mobile, normal contour.  No CMT  Adnexa: ovaries non-enlarged, no adnexal masses  Rectal: deferred  Lymphatic: no evidence of inguinal lymphadenopathy Extremities: no  edema, erythema, or tenderness Neurologic: Grossly intact Psychiatric: mood appropriate, affect full    Assessment: 53 y.o. G1P1001 routine annual exam  Plan: Problem List Items Addressed This Visit   None Visit Diagnoses       Well woman exam with routine gynecological exam    -  Primary   Relevant Orders   Cytology - PAP     Screening for cervical cancer       Relevant Orders   Cytology - PAP     Vasomotor symptoms due to menopause       Relevant Medications   norethindrone -ethinyl estradiol  (FEMHRT LOW DOSE) 0.5-2.5 MG-MCG tablet     Dyspareunia, female       Relevant Medications   norethindrone -ethinyl estradiol  (FEMHRT LOW DOSE) 0.5-2.5 MG-MCG tablet     History of abnormal cervical Pap smear       Relevant Orders   Cytology - PAP       1) Mammogram - recommend yearly screening mammogram.  Mammogram ordered by PCP  2) STI screening  was offered and declined  3) ASCCP guidelines and rationale discussed.  Patient opts for every 3  years screening interval following normal screen  4) Osteoporosis  - per USPTF routine screening DEXA at age 15  Consider FDA-approved medical therapies in postmenopausal women and men aged 1 years and older, based on the following: a) A hip or vertebral (clinical or morphometric) fracture b) T-score <= -2.5 at the femoral neck or spine after appropriate evaluation to exclude secondary causes C) Low bone mass (T-score between -1.0 and -2.5 at the femoral neck or spine) and a 10-year probability of a hip fracture >= 3% or a 10-year probability of a major osteoporosis-related fracture >= 20% based on the [place] -adapted WHO algorithm   5) Routine healthcare maintenance including cholesterol, diabetes screening discussed managed by PCP  6) Colonoscopy : referral to GI sent.  Screening recommended starting at age 23 for average risk individuals, age 60 for individuals deemed at increased risk (including African Americans) and recommended to  continue until age 75.  For patient age 14-85 individualized approach is recommended.  Gold standard screening is via colonoscopy, Cologuard screening is an acceptable alternative for patient unwilling or unable to undergo colonoscopy.  "Colorectal cancer screening for average?risk adults: 2018 guideline update from the American Cancer Society"CA: A Cancer Journal for Clinicians: Aug 30, 2016   7) Return in about 1 year (around 09/03/2024) for annual established gyn.    Angelita Kendall, CNM [facility] 09/04/2023 1:51 PM

## 2023-09-12 LAB — CYTOLOGY - PAP
Adequacy: ABNORMAL
Comment: NEGATIVE
High risk HPV: NEGATIVE

## 2023-09-17 ENCOUNTER — Ambulatory Visit: Payer: Self-pay | Admitting: Advanced Practice Midwife

## 2023-09-17 NOTE — Telephone Encounter (Signed)
 Discussed with Jerryl Morin who has discussed with Dr. Everardo Hitch who advised this is common in post menopausal women. Jerryl Morin will contact the patient to discuss a plan of starting estrogen for 1 month then repeating pap.

## 2023-09-17 NOTE — Telephone Encounter (Signed)
 Pt is scheduled with JEG on Friday, June 27 at 3:55.

## 2023-09-17 NOTE — Telephone Encounter (Signed)
 TRIAGE VOICEMAIL: Patient concerned about inadequate pap as this is the second time it happened. The first time was two years ago and she was not notified to return to repeat the pap smear. She would like a return call to follow up prior to her next scheduled appointment 09/28/23

## 2023-09-21 ENCOUNTER — Telehealth: Payer: Self-pay

## 2023-09-21 ENCOUNTER — Encounter: Payer: Self-pay | Admitting: *Deleted

## 2023-09-21 NOTE — Telephone Encounter (Signed)
 Discussed colonoscopy referral with patient.  She would like to have her referral placed with an office that would be closer to her in [place].  I told her I think [place] would be closer.   Thanks,  [place], [place]

## 2023-09-26 NOTE — Addendum Note (Signed)
 Addended by: Annsley Akkerman on: 09/26/2023 05:30 PM   Modules accepted: Orders

## 2023-09-27 ENCOUNTER — Encounter: Payer: Self-pay | Admitting: Gastroenterology

## 2023-09-28 ENCOUNTER — Ambulatory Visit: Admitting: Advanced Practice Midwife

## 2023-11-15 ENCOUNTER — Ambulatory Visit (AMBULATORY_SURGERY_CENTER): Payer: Self-pay

## 2023-11-15 ENCOUNTER — Encounter: Payer: Self-pay | Admitting: Gastroenterology

## 2023-11-15 VITALS — Ht 61.0 in | Wt 125.0 lb

## 2023-11-15 DIAGNOSIS — Z1211 Encounter for screening for malignant neoplasm of colon: Secondary | ICD-10-CM

## 2023-11-15 MED ORDER — NA SULFATE-K SULFATE-MG SULF 17.5-3.13-1.6 GM/177ML PO SOLN: 1.0000 | Freq: Once | ORAL | 0 refills | Status: AC

## 2023-11-15 NOTE — Telephone Encounter (Signed)
 Information only

## 2023-11-15 NOTE — Progress Notes (Signed)
Pre visit completed via phone call; Patient verified name, DOB, and address; No egg or soy allergy known to patient;  No issues known to pt with past sedation with any surgeries or procedures; Patient denies ever being told they had issues or difficulty with intubation;  No FH of Malignant Hyperthermia; Pt is not on diet pills; Pt is not on home 02;  Pt is not on blood thinners;  Pt denies issues with constipation;  No A fib or A flutter; Have any cardiac testing pending--NO Insurance verified during PV appt--- BCBS Pt can ambulate without assistance;  Pt denies use of chewing tobacco Discussed diabetic/weight loss medication holds; Discussed NSAID holds; Checked BMI to be less than 50; Pt instructed to use Singlecare.com or GoodRx for a price reduction on prep  Patient's chart reviewed by Cathlyn Parsons CNRA prior to previsit and patient appropriate for the [facility].  Pre visit completed and red dot placed by patient's name on their procedure day (on provider's schedule).    Instructions sent to MyChart per patient request;

## 2023-11-22 ENCOUNTER — Ambulatory Visit (INDEPENDENT_AMBULATORY_CARE_PROVIDER_SITE_OTHER): Payer: Self-pay | Admitting: Advanced Practice Midwife

## 2023-11-22 ENCOUNTER — Other Ambulatory Visit (HOSPITAL_COMMUNITY)
Admission: RE | Admit: 2023-11-22 | Discharge: 2023-11-22 | Disposition: A | Discharge status: Home / Self Care | Source: Ambulatory Visit | Attending: Advanced Practice Midwife | Admitting: Advanced Practice Midwife

## 2023-11-22 ENCOUNTER — Encounter: Payer: Self-pay | Admitting: Advanced Practice Midwife

## 2023-11-22 VITALS — BP 123/82 | HR 89 | Resp 16 | Ht 61.0 in | Wt 127.9 lb

## 2023-11-22 DIAGNOSIS — R87615 Unsatisfactory cytologic smear of cervix: Secondary | ICD-10-CM | POA: Diagnosis not present

## 2023-11-22 DIAGNOSIS — Z124 Encounter for screening for malignant neoplasm of cervix: Secondary | ICD-10-CM | POA: Insufficient documentation

## 2023-11-22 DIAGNOSIS — Z1151 Encounter for screening for human papillomavirus (HPV): Secondary | ICD-10-CM | POA: Insufficient documentation

## 2023-11-22 NOTE — Patient Instructions (Signed)
 Pap Test: What to Know Why am I having this test? A Pap test, also called a Pap smear, is a screening test to check for signs of: Infection. Cancer of the cervix. The cervix is the lowest part of the uterus. Precancerous changes. These are changes that may be a sign that cancer is developing. Females need this test regularly. In general, you should have a Pap test every 3 years until you reach menopause or you are 53 years old. If you are 59-26 years old you may choose to have their Pap test done at the same time as an human papillomavirus (HPV) test every 5 years instead of every 3 years. Your health care provider may recommend having Pap tests more or less often depending on your medical conditions and past Pap test results. What is being tested? Cervical cells are tested for signs of infection or abnormalities. What kind of sample is taken?  Your provider will collect a sample of cells from the surface of your cervix. This will be done using a small cotton swab, plastic spatula, or brush that is inserted into your vagina using a tool called a speculum. This sample is often collected during a pelvic exam, when you are lying on your back on an exam table with your feet in footrests, called stirrups. In some cases, fluids (secretions) from the cervix or vagina may also be collected. How do I prepare for this test? Know where you are in your menstrual cycle. If you're menstruating on the day of the test, you may be asked to reschedule. You may need to reschedule if you have a known vaginal infection on the day of the test. Follow instructions from your provider about: Changing or stopping your regular medicines. Some medicines, such as vaginal medicines and tetracycline, can cause abnormal test results. Avoiding douching 2-3 days before or the day of the test. Tell a health care provider about: Any allergies you have. All medicines you take. These include vitamins, herbs, eye drops, and  creams. Any bleeding problems you have. Any surgeries you've had. Any medical problems you have. Whether you're pregnant or may be pregnant. How are the results reported? Your test results will be reported as either abnormal or normal. What do the results mean? A normal test result means that you do not have signs of cancer of the cervix. An abnormal result may mean that you have: Cancer. A Pap test by itself is not enough to diagnose cancer. You will have more tests done if cancer is suspected. Precancerous changes in your cervix. Inflammation of the cervix. A sexually transmitted infection (STI). A fungal infection. An infection from a parasite. Talk with your provider about what your results mean. More tests may be needed. Questions to ask your health care provider Ask your provider, or the department that is doing the test: When will my results be ready? How will I get my results? What are my treatment options? What other tests do I need? What are my next steps? This information is not intended to replace advice given to you by your health care provider. Make sure you discuss any questions you have with your health care provider. Document Revised: 06/09/2023 Document Reviewed: 06/09/2023 Elsevier Patient Education  2025 [employer].

## 2023-11-22 NOTE — Progress Notes (Signed)
 Patient ID: Deborah Armstrong, female   DOB: 10-06-1970, 53 y.o.   MRN: [id number]  Reason for Consult: Gynecologic Exam   Referred by Alvera Reagin, PA  Subjective:  HPI:  Deborah Armstrong is a 53 y.o. female here for a repeat pap. Her last pap smear was on 09/04/2023 and it was negative for HPV and Unsatisfactory for evaluation due to extremely scant cellularity. She is reminded that HPV was negative on PAP in June.  We had discussed repeating PAP in 1 year. She has been taking HRT since June. She prefers to repeat PAP today as the diagnosis of scant cellularity concerns her. Advised that due to thinning/atrophy of vaginal tissue that this PAP may come back with the same diagnosis. She verbalizes understanding and would like to proceed with repeat PAP.   Has recently seen dermatology for thinning hair. Had a root canal and had a mammogram in recent months. She is scheduled for colonoscopy.    Past Medical History:  Diagnosis Date   Diffuse cystic mastopathy 2013   Menopause    age 23   Family History  Problem Relation Age of Onset   Congestive Heart Failure Mother    Hypertension Mother    Hypertension Sister    Sickle cell anemia Sister    Hypertension Brother    Cancer Neg Hx    Colon polyps Neg Hx    Colon cancer Neg Hx    Esophageal cancer Neg Hx    Stomach cancer Neg Hx    Rectal cancer Neg Hx    Past Surgical History:  Procedure Laterality Date   BREAST SURGERY Left 2010   FNA and left breast mass excision   CESAREAN SECTION  2012   WISDOM TOOTH EXTRACTION      Short Social History:  Social History   Tobacco Use   Smoking status: Never   Smokeless tobacco: Never  Substance Use Topics   Alcohol use: Yes    Comment: soc    No Known Allergies  Current Outpatient Medications  Medication Sig Dispense Refill   BIOTIN PO Take 10,000 mcg by mouth daily at 6 (six) AM.     Multiple Vitamin (MULTIVITAMIN PO) Take 1 tablet by mouth daily at 6 (six) AM.      Niacin (VITAMIN B-3 PO) Take 50 mcg by mouth daily at 6 (six) AM.     norethindrone -ethinyl estradiol  (FEMHRT LOW DOSE) 0.5-2.5 MG-MCG tablet Take 1 tablet by mouth daily. 30 tablet 11   VITAMIN E PO Take 180 mg by mouth daily.     No current facility-administered medications for this visit.    Review of Systems  Constitutional:  Negative for chills and fever.  HENT:  Negative for congestion, ear discharge, ear pain, hearing loss, sinus pain and sore throat.   Eyes:  Negative for blurred vision and double vision.  Respiratory:  Negative for cough, shortness of breath and wheezing.   Cardiovascular:  Negative for chest pain, palpitations and leg swelling.  Gastrointestinal:  Negative for abdominal pain, blood in stool, constipation, diarrhea, heartburn, melena, nausea and vomiting.  Genitourinary:  Negative for dysuria, flank pain, frequency, hematuria and urgency.  Musculoskeletal:  Negative for back pain, joint pain and myalgias.  Skin:  Negative for itching and rash.  Neurological:  Negative for dizziness, tingling, tremors, sensory change, speech change, focal weakness, seizures, loss of consciousness, weakness and headaches.  Endo/Heme/Allergies:  Negative for environmental allergies. Does not bruise/bleed easily.  Psychiatric/Behavioral:  Negative for  depression, hallucinations, memory loss, substance abuse and suicidal ideas. The patient is not nervous/anxious and does not have insomnia.        Objective:  Objective   Vitals:   11/22/23 1319  BP: 123/82  Pulse: 89  Resp: 16  Weight: 127 lb 14.4 oz (58 kg)  Height: 5' 1 (1.549 m)   Body mass index is 24.17 kg/m. Constitutional: Well nourished, well developed female in no acute distress.  HEENT: normal Skin: Warm and dry.  Cardiovascular: Regular rate and rhythm.   Extremity: no edema  Respiratory:  Normal respiratory effort Psych: Alert and Oriented x3. No memory deficits. Normal mood and affect.    Pelvic exam:   is not limited by body habitus EGBUS: within normal limits Vagina:  atrophy of vaginal tissue Cervix: atrophy/thinning of cervical tissue, blood vessels noted on surface   Assessment/Plan:     53 y.o. G1 P1001 female repeat PAP smear for:  HIGH RISK HPV ([place]): Negative ADEQUACY: UNSATISFACTORY for evaluation due to extremely scant cellularity.  Repeat PAP smear today Follow up as needed   Slater Rains CNM  OB/GYN

## 2023-11-27 LAB — CYTOLOGY - PAP
Comment: NEGATIVE
Diagnosis: NEGATIVE
Diagnosis: REACTIVE
High risk HPV: NEGATIVE

## 2023-11-28 DIAGNOSIS — Z0000 Encounter for general adult medical examination without abnormal findings: Secondary | ICD-10-CM | POA: Diagnosis not present

## 2023-11-28 DIAGNOSIS — Z131 Encounter for screening for diabetes mellitus: Secondary | ICD-10-CM | POA: Diagnosis not present

## 2023-11-28 DIAGNOSIS — Z1211 Encounter for screening for malignant neoplasm of colon: Secondary | ICD-10-CM | POA: Diagnosis not present

## 2023-11-28 DIAGNOSIS — Z13220 Encounter for screening for lipoid disorders: Secondary | ICD-10-CM | POA: Diagnosis not present

## 2023-11-29 ENCOUNTER — Encounter: Admitting: Gastroenterology

## 2023-11-30 ENCOUNTER — Ambulatory Visit: Payer: Self-pay | Admitting: Advanced Practice Midwife

## 2023-12-13 ENCOUNTER — Encounter: Payer: Self-pay | Admitting: Gastroenterology

## 2023-12-13 ENCOUNTER — Ambulatory Visit (AMBULATORY_SURGERY_CENTER): Admitting: Gastroenterology

## 2023-12-13 VITALS — BP 120/69 | HR 75 | Temp 97.3°F | Resp 14 | Ht 61.0 in | Wt 125.0 lb

## 2023-12-13 DIAGNOSIS — Z1211 Encounter for screening for malignant neoplasm of colon: Secondary | ICD-10-CM | POA: Diagnosis not present

## 2023-12-13 DIAGNOSIS — K635 Polyp of colon: Secondary | ICD-10-CM | POA: Diagnosis not present

## 2023-12-13 DIAGNOSIS — K5140 Inflammatory polyps of colon without complications: Secondary | ICD-10-CM | POA: Diagnosis not present

## 2023-12-13 DIAGNOSIS — D124 Benign neoplasm of descending colon: Secondary | ICD-10-CM

## 2023-12-13 MED ORDER — SODIUM CHLORIDE 0.9 % IV SOLN: 500.0000 mL | Freq: Once | INTRAVENOUS | Status: AC

## 2023-12-13 NOTE — Op Note (Signed)
 [facility] Patient Name: Deborah Armstrong Procedure Date: 12/13/2023 8:01 AM MRN: [id number] Endoscopist: Sandor Flatter , MD, [id number] Age: 53 Referring MD:  Date of Birth: [birth year] Gender: Female Account #: [id number] Procedure:                Colonoscopy Indications:              Screening for colorectal malignant neoplasm, This                            is the patient's first colonoscopy Medicines:                Monitored Anesthesia Care Procedure:                Pre-Anesthesia Assessment:                           - Prior to the procedure, a History and Physical                            was performed, and patient medications and                            allergies were reviewed. The patient's tolerance of                            previous anesthesia was also reviewed. The risks                            and benefits of the procedure and the sedation                            options and risks were discussed with the patient.                            All questions were answered, and informed consent                            was obtained. Prior Anticoagulants: The patient has                            taken no anticoagulant or antiplatelet agents. ASA                            Grade Assessment: I - A normal, healthy patient.                            After reviewing the risks and benefits, the patient                            was deemed in satisfactory condition to undergo the                            procedure.  After obtaining informed consent, the colonoscope                            was passed under direct vision. Throughout the                            procedure, the patient's blood pressure, pulse, and                            oxygen saturations were monitored continuously. The                            Olympus Scope SN: [id number] was introduced through                            the anus and advanced to the the  cecum, identified                            by appendiceal orifice and ileocecal valve. The                            colonoscopy was performed without difficulty. The                            patient tolerated the procedure well. The quality                            of the bowel preparation was excellent. The                            ileocecal valve, appendiceal orifice, and rectum                            were photographed. Scope In: 8:31:59 AM Scope Out: 8:45:03 AM Scope Withdrawal Time: 0 hours 10 minutes 49 seconds  Total Procedure Duration: 0 hours 13 minutes 4 seconds  Findings:                 The perianal and digital rectal examinations were                            normal.                           A 4 mm polyp was found in the descending colon. The                            polyp was sessile. The polyp was removed with a                            cold snare. Resection and retrieval were complete.                            Estimated blood loss was minimal.  The exam was otherwise normal throughout the                            remainder of the colon.                           The retroflexed view of the distal rectum and anal                            verge was normal and showed no anal or rectal                            abnormalities. Complications:            No immediate complications. Estimated Blood Loss:     Estimated blood loss was minimal. Impression:               - One 4 mm polyp in the descending colon, removed                            with a cold snare. Resected and retrieved.                           - The remainder of the colon was otherwise normal                            appearing.                           - The distal rectum and anal verge are normal on                            retroflexion view. Recommendation:           - Patient has a contact number available for                            emergencies.  The signs and symptoms of potential                            delayed complications were discussed with the                            patient. Return to normal activities tomorrow.                            Written discharge instructions were provided to the                            patient.                           - Resume previous diet.                           - Continue present medications.                           -  Await pathology results.                           - Repeat colonoscopy for surveillance based on                            pathology results.                           - Return to GI office PRN. Sandor Flatter, MD 12/13/2023 8:59:32 AM

## 2023-12-13 NOTE — Progress Notes (Signed)
 Called to room to assist during endoscopic procedure.  Patient ID and intended procedure confirmed with present staff. Received instructions for my participation in the procedure from the performing physician.

## 2023-12-13 NOTE — Progress Notes (Signed)
 Sedate, gd SR, tolerated procedure well, VSS, report to RN

## 2023-12-13 NOTE — Progress Notes (Signed)
 GASTROENTEROLOGY PROCEDURE H&P NOTE   Primary Care Physician: Redmon, Noelle, PA    Reason for Procedure:  Colon Cancer screening  Plan:    Colonoscopy  Patient is appropriate for endoscopic procedure(s) in the ambulatory ([facility]) setting.  The nature of the procedure, as well as the risks, benefits, and alternatives were carefully and thoroughly reviewed with the patient. Ample time for discussion and questions allowed. The patient understood, was satisfied, and agreed to proceed.     HPI: Deborah Armstrong is a 53 y.o. female who presents for colonoscopy for routine Colon Cancer screening.  No active GI symptoms.  No known family history of colon cancer or related malignancy.  Patient is otherwise without complaints or active issues today.  Past Medical History:  Diagnosis Date   Diffuse cystic mastopathy 2013   Menopause    age 28    Past Surgical History:  Procedure Laterality Date   BREAST SURGERY Left 2010   FNA and left breast mass excision   CESAREAN SECTION  2012   WISDOM TOOTH EXTRACTION      Prior to Admission medications   Medication Sig Start Date End Date Taking? Authorizing Provider  BIOTIN PO Take 10,000 mcg by mouth daily at 6 (six) AM.    [provider]  Multiple Vitamin (MULTIVITAMIN PO) Take 1 tablet by mouth daily at 6 (six) AM.    [provider]  Niacin (VITAMIN B-3 PO) Take 50 mcg by mouth daily at 6 (six) AM.    [provider]  norethindrone -ethinyl estradiol  (FEMHRT LOW DOSE) 0.5-2.5 MG-MCG tablet Take 1 tablet by mouth daily. 09/04/23   Lynda Bradley, CNM  VITAMIN E PO Take 180 mg by mouth daily.    [provider]    Current Outpatient Medications  Medication Sig Dispense Refill   BIOTIN PO Take 10,000 mcg by mouth daily at 6 (six) AM.     Multiple Vitamin (MULTIVITAMIN PO) Take 1 tablet by mouth daily at 6 (six) AM.     Niacin (VITAMIN B-3 PO) Take 50 mcg by mouth daily at 6 (six) AM.      norethindrone -ethinyl estradiol  (FEMHRT LOW DOSE) 0.5-2.5 MG-MCG tablet Take 1 tablet by mouth daily. 30 tablet 11   VITAMIN E PO Take 180 mg by mouth daily.     No current facility-administered medications for this visit.    Allergies as of 12/13/2023   (No Known Allergies)    Family History  Problem Relation Age of Onset   Congestive Heart Failure Mother    Hypertension Mother    Hypertension Sister    Sickle cell anemia Sister    Hypertension Brother    Cancer Neg Hx    Colon polyps Neg Hx    Colon cancer Neg Hx    Esophageal cancer Neg Hx    Stomach cancer Neg Hx    Rectal cancer Neg Hx     Social History   Socioeconomic History   Marital status: Married    Spouse name: Not on file   Number of children: 1   Years of education: Not on file   Highest education level: Master's degree (e.g., MA, MS, MEng, MEd, MSW, MBA)  Occupational History   Occupation: Retail buyer  Tobacco Use   Smoking status: Never   Smokeless tobacco: Never  Vaping Use   Vaping status: Never Used  Substance and Sexual Activity   Alcohol use: Yes    Comment: soc   Drug use: No  Sexual activity: Yes    Birth control/protection: Post-menopausal  Other Topics Concern   Not on file  Social History Narrative   Not on file   Social Drivers of Health   Financial Resource Strain: Not on file  Food Insecurity: Not on file  Transportation Needs: Not on file  Physical Activity: Not on file  Stress: Not on file  Social Connections: Unknown (08/14/2021)   Received from [facility]   Social Network    Social Network: Not on file  Intimate Partner Violence: Unknown (07/06/2021)   Received from [facility]   HITS    Physically Hurt: Not on file    Insult or Talk Down To: Not on file    Threaten Physical Harm: Not on file    Scream or Curse: Not on file    Physical Exam: Vital signs in last 24 hours: @LMP  08/26/2014  GEN: NAD EYE: Sclerae anicteric ENT: MMM CV:  Non-tachycardic Pulm: CTA b/l GI: Soft, NT/ND NEURO:  Alert & Oriented x 3   Sandor Flatter, DO [facility]   12/13/2023 7:55 AM

## 2023-12-13 NOTE — Patient Instructions (Signed)
 Resume previous diet. Continue present medications. Awaiting pathology results. Repeat colonoscopy for surveillance based on pathology results. Handout provided on polyps.  YOU HAD AN ENDOSCOPIC PROCEDURE TODAY AT THE [facility]:   Refer to the procedure report that was given to you for any specific questions about what was found during the examination.  If the procedure report does not answer your questions, please call your gastroenterologist to clarify.  If you requested that your care partner not be given the details of your procedure findings, then the procedure report has been included in a sealed envelope for you to review at your convenience later.  YOU SHOULD EXPECT: Some feelings of bloating in the abdomen. Passage of more gas than usual.  Walking can help get rid of the air that was put into your GI tract during the procedure and reduce the bloating. If you had a lower endoscopy (such as a colonoscopy or flexible sigmoidoscopy) you may notice spotting of blood in your stool or on the toilet paper. If you underwent a bowel prep for your procedure, you may not have a normal bowel movement for a few days.  Please Note:  You might notice some irritation and congestion in your nose or some drainage.  This is from the oxygen used during your procedure.  There is no need for concern and it should clear up in a day or so.  SYMPTOMS TO REPORT IMMEDIATELY:  Following lower endoscopy (colonoscopy or flexible sigmoidoscopy):  Excessive amounts of blood in the stool  Significant tenderness or worsening of abdominal pains  Swelling of the abdomen that is new, acute  Fever of 100F or higher  For urgent or emergent issues, a gastroenterologist can be reached at any hour by calling [phone]. Do not use MyChart messaging for urgent concerns.    DIET:  We do recommend a small meal at first, but then you may proceed to your regular diet.  Drink plenty of fluids but you should  avoid alcoholic beverages for 24 hours.  ACTIVITY:  You should plan to take it easy for the rest of today and you should NOT DRIVE or use heavy machinery until tomorrow (because of the sedation medicines used during the test).    FOLLOW UP: Our staff will call the number listed on your records the next business day following your procedure.  We will call around 7:15- 8:00 am to check on you and address any questions or concerns that you may have regarding the information given to you following your procedure. If we do not reach you, we will leave a message.     If any biopsies were taken you will be contacted by phone or by letter within the next 1-3 weeks.  Please call us  at [phone] if you have not heard about the biopsies in 3 weeks.    SIGNATURES/CONFIDENTIALITY: You and/or your care partner have signed paperwork which will be entered into your electronic medical record.  These signatures attest to the fact that that the information above on your After Visit Summary has been reviewed and is understood.  Full responsibility of the confidentiality of this discharge information lies with you and/or your care-partner.

## 2023-12-14 ENCOUNTER — Telehealth: Payer: Self-pay

## 2023-12-14 NOTE — Telephone Encounter (Signed)
  Follow up Call-     12/13/2023    8:02 AM  Call back number  Post procedure Call Back phone  # [phone]  Permission to leave phone message Yes     Patient questions:  Do you have a fever, pain , or abdominal swelling? No. Pain Score  0 *  Have you tolerated food without any problems? Yes.    Have you been able to return to your normal activities? Yes.    Do you have any questions about your discharge instructions: Diet   No. Medications  No. Follow up visit  No.  Do you have questions or concerns about your Care? No.  Actions: * If pain score is 4 or above: No action needed, pain <4.

## 2023-12-18 LAB — SURGICAL PATHOLOGY

## 2023-12-25 ENCOUNTER — Ambulatory Visit: Payer: Self-pay | Admitting: Gastroenterology

## 2023-12-27 ENCOUNTER — Other Ambulatory Visit (HOSPITAL_BASED_OUTPATIENT_CLINIC_OR_DEPARTMENT_OTHER): Payer: Self-pay

## 2023-12-27 MED ORDER — FLUZONE 0.5 ML IM SUSY
0.5000 mL | PREFILLED_SYRINGE | Freq: Once | INTRAMUSCULAR | 0 refills | Status: AC
Start: 2023-12-27 — End: 2023-12-28
  Filled 2023-12-27: qty 0.5, 1d supply, fill #0

## 2024-03-05 ENCOUNTER — Encounter: Payer: Self-pay | Admitting: Dermatology

## 2024-03-05 ENCOUNTER — Ambulatory Visit: Admitting: Dermatology

## 2024-03-05 VITALS — BP 112/78 | HR 88

## 2024-03-05 DIAGNOSIS — L649 Androgenic alopecia, unspecified: Secondary | ICD-10-CM | POA: Diagnosis not present

## 2024-03-05 DIAGNOSIS — Z79899 Other long term (current) drug therapy: Secondary | ICD-10-CM | POA: Diagnosis not present

## 2024-03-05 MED ORDER — SAFETY SEAL MISCELLANEOUS MISC: 1.0000 | Freq: Every morning | 6 refills | Status: AC

## 2024-03-05 NOTE — Patient Instructions (Addendum)
 VISIT SUMMARY:  Today, we discussed your concerns about hair thinning and shedding. We reviewed your history and current treatments, and I provided a plan to address your hair loss issues.  YOUR PLAN:  -CENTRAL CICATRICIAL CENTRIFUGAL ALOPECIA:  This is a type of hair loss that starts at the crown of the head and can spread outwards, often caused by inflammation and scarring.  It can be triggered by chemical relaxers.  To manage this, I have prescribed clobetasol to control inflammation for 6-12 months.   Please avoid using chemical relaxers, tight hairstyles, and excessive heat. Apply the compounded solution daily in the morning to prevent unwanted hair growth on your face.  -ANDROGENETIC ALOPECIA:  This is a common form of hair loss that occurs after menopause and is influenced by genetics.   To help stop hair loss and promote regrowth, I have prescribed a compounded solution containing minoxidil and finasteride.   Please apply this solution daily in the morning. Additionally, I recommend taking collagen supplements to support hair growth. We will follow up in 4-6 months to assess your progress and compare pictures.  INSTRUCTIONS:  Please follow up in 4-6 months to assess your progress and compare pictures. Continue with the prescribed treatments and avoid chemical relaxers, tight hairstyles, and excessive heat.           Important Information  Due to recent changes in healthcare laws, you may see results of your pathology and/or laboratory studies on MyChart before the doctors have had a chance to review them. We understand that in some cases there may be results that are confusing or concerning to you. Please understand that not all results are received at the same time and often the doctors may need to interpret multiple results in order to provide you with the best plan of care or course of treatment. Therefore, we ask that you please give us  2 business days to thoroughly  review all your results before contacting the office for clarification. Should we see a critical lab result, you will be contacted sooner.   If You Need Anything After Your Visit  If you have any questions or concerns for your doctor, please call our main line at [phone] If no one answers, please leave a voicemail as directed and we will return your call as soon as possible. Messages left after 4 pm will be answered the following business day.   You may also send us  a message via MyChart. We typically respond to MyChart messages within 1-2 business days.  For prescription refills, please ask your pharmacy to contact our office. Our fax number is [phone].  If you have an urgent issue when the clinic is closed that cannot wait until the next business day, you can page your doctor at the number below.    Please note that while we do our best to be available for urgent issues outside of office hours, we are not available 24/7.   If you have an urgent issue and are unable to reach us , you may choose to seek medical care at your doctor's office, retail clinic, urgent care center, or emergency room.  If you have a medical emergency, please immediately call 911 or go to the emergency department. In the event of inclement weather, please call our main line at [phone] for an update on the status of any delays or closures.  Dermatology Medication Tips: Please keep the boxes that topical medications come in in order to help keep track of the instructions  about where and how to use these. Pharmacies typically print the medication instructions only on the boxes and not directly on the medication tubes.   If your medication is too expensive, please contact our office at [phone] or send us  a message through MyChart.   We are unable to tell what your co-pay for medications will be in advance as this is different depending on your insurance coverage. However, we may be able to find a  substitute medication at lower cost or fill out paperwork to get insurance to cover a needed medication.   If a prior authorization is required to get your medication covered by your insurance company, please allow us  1-2 business days to complete this process.  Drug prices often vary depending on where the prescription is filled and some pharmacies may offer cheaper prices.  The website www.goodrx.com contains coupons for medications through different pharmacies. The prices here do not account for what the cost may be with help from insurance (it may be cheaper with your insurance), but the website can give you the price if you did not use any insurance.  - You can print the associated coupon and take it with your prescription to the pharmacy.  - You may also stop by our office during regular business hours and pick up a GoodRx coupon card.  - If you need your prescription sent electronically to a different pharmacy, notify our office through [facility] or by phone at [phone]

## 2024-03-05 NOTE — Progress Notes (Unsigned)
   New Patient Visit  Patient (and/or pt guardian) consented to the use of AI-assisted tools for note generation.    Subjective  Deborah Armstrong is a 53 y.o. female who presents for the following: Hair loss  ocated at the scalp that she would like to have examined.  Patient reports the areas have been there since January Patient reports that she had a large amount of hair loss on after a hair wash and does not reports minor shedding since but has noticed hair is not as thick as it was before She reports the areas are not tender and do not itch She states that the areas have not spread.  Patient reports she has not previously been treated for these areas.  Patient reports she is not using any OTC treatments Patient goes to a salon to have her hair washed and is unsure of shampoo used Patient reports she has not used a relaxer since October of last year. Patient reports in the past she used relaxers every 3 to 4 months Patient reports she does not use scalp or hair oils Patient reports she takes mutivitamins but has not tried a collagen supplement Patient reports she has not had braids or tight hair styles Patient reports family hx of hair loss with older sister   The following portions of the chart were reviewed this encounter and updated as appropriate: medications, allergies, medical history  Review of Systems:  No other skin or systemic complaints except as noted in HPI or Assessment and Plan.  Objective  Well appearing patient in no apparent distress; mood and affect are within normal limits.  A focused examination was performed of the following areas: Scalp  Relevant exam findings are noted in the Assessment and Plan.                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  In women androgenetic alopecia is commonly associated with menopause but may occur any time after puberty.  It causes hair thinning primarily on the crown with widening of the part and temporal hairline recession.    Treatment Plan: Prescribed hormonic hair solution from [employer] (Minoxidil 8%, Finasteride 0.05% and clobetasol 0.05%) to use twice daily Recommended use of Viviscal tablets, 2 tablets daily Recommended use of Vital Proteins Collagen Supplement   Long term medication management.  Patient is using long term (months to years) prescription medication  to control their dermatologic condition.  These medications require periodic monitoring to evaluate for efficacy and side effects and may require periodic laboratory monitoring.      ANDROGENIC ALOPECIA    Return in about 6 months (around 09/03/2024) for Alopecia .  I, Lyle Cords, as acting as a neurosurgeon for [employer], DO .   Documentation: I have reviewed the above documentation for accuracy and completeness, and I agree with the above.  Delon Lenis, DO

## 2024-03-23 ENCOUNTER — Encounter: Payer: Self-pay | Admitting: Advanced Practice Midwife

## 2024-03-31 ENCOUNTER — Other Ambulatory Visit: Payer: Self-pay | Admitting: Physician Assistant

## 2024-03-31 DIAGNOSIS — Z1231 Encounter for screening mammogram for malignant neoplasm of breast: Secondary | ICD-10-CM

## 2024-04-07 ENCOUNTER — Other Ambulatory Visit (HOSPITAL_COMMUNITY)
Admission: RE | Admit: 2024-04-07 | Discharge: 2024-04-07 | Disposition: A | Discharge status: Home / Self Care | Source: Ambulatory Visit | Attending: Obstetrics & Gynecology | Admitting: Obstetrics & Gynecology

## 2024-04-07 ENCOUNTER — Ambulatory Visit (INDEPENDENT_AMBULATORY_CARE_PROVIDER_SITE_OTHER)

## 2024-04-07 VITALS — BP 106/71 | HR 94 | Ht 61.0 in | Wt 130.3 lb

## 2024-04-07 DIAGNOSIS — N898 Other specified noninflammatory disorders of vagina: Secondary | ICD-10-CM | POA: Insufficient documentation

## 2024-04-07 NOTE — Progress Notes (Signed)
" ° ° °  NURSE VISIT NOTE  Subjective:    Deborah Armstrong ID: Deborah Armstrong, female    DOB: 10-16-1970, 54 y.o.   MRN: [id number]  HPI  Deborah Armstrong is a 54 y.o. [id number] female who presents for vaginal irritation for 2 week(s). Denies abnormal vaginal bleeding or significant pelvic pain or fever. denies dysuria, urinary urgency, and pelvic pain. Deborah Armstrong denies a history of known exposure to STD.   Objective:    LMP 08/26/2014    No results found for any visits on 04/07/24.  Assessment:   No diagnosis found.  nonspecific vaginitis  Plan:   GC and chlamydia DNA  probe sent to lab. ROV prn if symptoms persist or worsen.   Mathis LITTIE Getting, CMA  "

## 2024-04-08 ENCOUNTER — Encounter: Payer: Self-pay | Admitting: Certified Nurse Midwife

## 2024-04-08 LAB — CERVICOVAGINAL ANCILLARY ONLY
Bacterial Vaginitis (gardnerella): NEGATIVE
Candida Glabrata: NEGATIVE
Candida Vaginitis: NEGATIVE
Chlamydia: NEGATIVE
Comment: NEGATIVE
Comment: NEGATIVE
Comment: NEGATIVE
Comment: NEGATIVE
Comment: NEGATIVE
Comment: NORMAL
Neisseria Gonorrhea: NEGATIVE
Trichomonas: NEGATIVE

## 2024-04-17 ENCOUNTER — Other Ambulatory Visit: Payer: Self-pay | Admitting: Physician Assistant

## 2024-04-17 DIAGNOSIS — R1032 Left lower quadrant pain: Secondary | ICD-10-CM

## 2024-04-25 ENCOUNTER — Ambulatory Visit
Admission: RE | Admit: 2024-04-25 | Discharge: 2024-04-25 | Disposition: A | Source: Ambulatory Visit | Attending: Physician Assistant | Admitting: Physician Assistant

## 2024-04-25 DIAGNOSIS — R1032 Left lower quadrant pain: Secondary | ICD-10-CM

## 2024-04-25 MED ORDER — IOPAMIDOL (ISOVUE-370) INJECTION 76%
75.0000 mL | Freq: Once | INTRAVENOUS | Status: AC | PRN
  Administered 2024-04-25: 75 mL via INTRAVENOUS

## 2024-08-28 ENCOUNTER — Ambulatory Visit: Admitting: Dermatology

## 2024-09-05 ENCOUNTER — Ambulatory Visit
